# Patient Record
Sex: Female | Born: 1935 | Race: White | Hispanic: No | Smoking: Never smoker
Health system: Southern US, Community
[De-identification: ages and names within clinical notes are randomized; demographics above are authoritative.]

## PROBLEM LIST (undated history)

## (undated) DIAGNOSIS — C44320 Squamous cell carcinoma of skin of unspecified parts of face: Secondary | ICD-10-CM

## (undated) DIAGNOSIS — B019 Varicella without complication: Secondary | ICD-10-CM

## (undated) DIAGNOSIS — E039 Hypothyroidism, unspecified: Secondary | ICD-10-CM

## (undated) DIAGNOSIS — T8859XA Other complications of anesthesia, initial encounter: Secondary | ICD-10-CM

## (undated) DIAGNOSIS — K219 Gastro-esophageal reflux disease without esophagitis: Secondary | ICD-10-CM

## (undated) DIAGNOSIS — Z0000 Encounter for general adult medical examination without abnormal findings: Secondary | ICD-10-CM

## (undated) DIAGNOSIS — Z974 Presence of external hearing-aid: Secondary | ICD-10-CM

## (undated) DIAGNOSIS — E782 Mixed hyperlipidemia: Secondary | ICD-10-CM

## (undated) DIAGNOSIS — G20 Parkinson's disease: Secondary | ICD-10-CM

## (undated) DIAGNOSIS — I10 Essential (primary) hypertension: Secondary | ICD-10-CM

## (undated) DIAGNOSIS — T4145XA Adverse effect of unspecified anesthetic, initial encounter: Secondary | ICD-10-CM

## (undated) DIAGNOSIS — G20A1 Parkinson's disease without dyskinesia, without mention of fluctuations: Secondary | ICD-10-CM

## (undated) DIAGNOSIS — I4891 Unspecified atrial fibrillation: Secondary | ICD-10-CM

## (undated) DIAGNOSIS — I350 Nonrheumatic aortic (valve) stenosis: Secondary | ICD-10-CM

## (undated) DIAGNOSIS — K635 Polyp of colon: Secondary | ICD-10-CM

## (undated) HISTORY — DX: Varicella without complication: B01.9

## (undated) HISTORY — DX: Encounter for general adult medical examination without abnormal findings: Z00.00

## (undated) HISTORY — PX: INSERT / REPLACE / REMOVE PACEMAKER: SUR710

## (undated) HISTORY — PX: APPENDECTOMY: SHX54

## (undated) HISTORY — PX: AXILLARY LYMPH NODE BIOPSY: SHX5737

## (undated) HISTORY — DX: Polyp of colon: K63.5

## (undated) HISTORY — PX: TONSILLECTOMY: SUR1361

## (undated) HISTORY — DX: Mixed hyperlipidemia: E78.2

## (undated) HISTORY — PX: WISDOM TOOTH EXTRACTION: SHX21

## (undated) HISTORY — DX: Essential (primary) hypertension: I10

## (undated) HISTORY — DX: Squamous cell carcinoma of skin of unspecified parts of face: C44.320

## (undated) HISTORY — DX: Hypothyroidism, unspecified: E03.9

## (undated) HISTORY — DX: Nonrheumatic aortic (valve) stenosis: I35.0

## (undated) HISTORY — PX: CHOLECYSTECTOMY: SHX55

---
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
## 2004-03-19 ENCOUNTER — Ambulatory Visit: Payer: Self-pay | Admitting: Internal Medicine

## 2005-03-20 ENCOUNTER — Ambulatory Visit: Payer: Self-pay | Admitting: Internal Medicine

## 2005-03-26 ENCOUNTER — Inpatient Hospital Stay: Payer: Self-pay | Admitting: Surgery

## 2005-03-27 ENCOUNTER — Other Ambulatory Visit: Payer: Self-pay

## 2005-11-22 ENCOUNTER — Ambulatory Visit: Payer: Self-pay | Admitting: Unknown Physician Specialty

## 2006-04-07 ENCOUNTER — Ambulatory Visit: Payer: Self-pay | Admitting: Internal Medicine

## 2007-06-03 ENCOUNTER — Ambulatory Visit: Payer: Self-pay | Admitting: Internal Medicine

## 2008-01-31 ENCOUNTER — Ambulatory Visit: Payer: Self-pay | Admitting: Unknown Physician Specialty

## 2008-02-23 ENCOUNTER — Ambulatory Visit: Payer: Self-pay | Admitting: Unknown Physician Specialty

## 2008-03-14 ENCOUNTER — Ambulatory Visit: Payer: Self-pay | Admitting: Unknown Physician Specialty

## 2008-06-05 ENCOUNTER — Ambulatory Visit: Payer: Self-pay | Admitting: Internal Medicine

## 2009-06-06 ENCOUNTER — Ambulatory Visit: Payer: Self-pay | Admitting: Internal Medicine

## 2010-06-10 ENCOUNTER — Ambulatory Visit: Payer: Self-pay | Admitting: Internal Medicine

## 2010-12-10 ENCOUNTER — Observation Stay: Payer: Self-pay | Admitting: Internal Medicine

## 2011-06-18 ENCOUNTER — Ambulatory Visit: Payer: Self-pay | Admitting: Internal Medicine

## 2012-06-18 ENCOUNTER — Ambulatory Visit: Payer: Self-pay | Admitting: Internal Medicine

## 2013-06-20 ENCOUNTER — Ambulatory Visit: Payer: Self-pay | Admitting: Internal Medicine

## 2015-02-07 DIAGNOSIS — I350 Nonrheumatic aortic (valve) stenosis: Secondary | ICD-10-CM

## 2015-02-07 HISTORY — DX: Nonrheumatic aortic (valve) stenosis: I35.0

## 2015-08-09 DIAGNOSIS — E039 Hypothyroidism, unspecified: Secondary | ICD-10-CM

## 2015-08-09 DIAGNOSIS — I10 Essential (primary) hypertension: Secondary | ICD-10-CM

## 2015-08-09 DIAGNOSIS — E782 Mixed hyperlipidemia: Secondary | ICD-10-CM

## 2015-08-09 DIAGNOSIS — E785 Hyperlipidemia, unspecified: Secondary | ICD-10-CM | POA: Insufficient documentation

## 2015-08-09 HISTORY — DX: Mixed hyperlipidemia: E78.2

## 2015-08-09 HISTORY — DX: Essential (primary) hypertension: I10

## 2015-08-09 HISTORY — DX: Hypothyroidism, unspecified: E03.9

## 2016-02-11 DIAGNOSIS — Z0000 Encounter for general adult medical examination without abnormal findings: Secondary | ICD-10-CM

## 2016-02-11 HISTORY — DX: Encounter for general adult medical examination without abnormal findings: Z00.00

## 2016-06-19 DIAGNOSIS — C44320 Squamous cell carcinoma of skin of unspecified parts of face: Secondary | ICD-10-CM

## 2016-06-19 HISTORY — DX: Squamous cell carcinoma of skin of unspecified parts of face: C44.320

## 2016-07-08 HISTORY — PX: MOHS SURGERY: SUR867

## 2017-08-24 ENCOUNTER — Encounter: Payer: Self-pay | Admitting: *Deleted

## 2017-08-24 ENCOUNTER — Other Ambulatory Visit: Payer: Self-pay | Admitting: *Deleted

## 2017-08-24 ENCOUNTER — Telehealth: Payer: Self-pay | Admitting: *Deleted

## 2017-08-24 NOTE — Telephone Encounter (Signed)
Fax referral from Dr. Yevonne Pax, MD of [facility] placed in scheduling box. P#[phone]  F# [phone]

## 2017-10-08 DIAGNOSIS — Z952 Presence of prosthetic heart valve: Secondary | ICD-10-CM

## 2017-10-08 HISTORY — DX: Presence of prosthetic heart valve: Z95.2

## 2017-10-08 HISTORY — PX: AORTIC VALVE REPLACEMENT: SHX41

## 2017-11-03 DIAGNOSIS — Z950 Presence of cardiac pacemaker: Secondary | ICD-10-CM

## 2017-11-03 HISTORY — DX: Presence of cardiac pacemaker: Z95.0

## 2017-11-05 ENCOUNTER — Encounter
Admission: RE | Admit: 2017-11-05 | Discharge: 2017-11-05 | Disposition: A | Discharge status: Home / Self Care | Payer: Self-pay | Source: Ambulatory Visit | Attending: Internal Medicine | Admitting: Internal Medicine

## 2018-05-04 ENCOUNTER — Emergency Department: Payer: Medicare Other

## 2018-05-04 ENCOUNTER — Encounter: Payer: Self-pay | Admitting: Emergency Medicine

## 2018-05-04 ENCOUNTER — Other Ambulatory Visit: Payer: Self-pay

## 2018-05-04 ENCOUNTER — Observation Stay
Admission: EM | Admit: 2018-05-04 | Discharge: 2018-05-05 | Disposition: A | Discharge status: Home / Self Care | Payer: Medicare Other | Attending: Internal Medicine | Admitting: Internal Medicine

## 2018-05-04 DIAGNOSIS — Z79899 Other long term (current) drug therapy: Secondary | ICD-10-CM | POA: Diagnosis not present

## 2018-05-04 DIAGNOSIS — I248 Other forms of acute ischemic heart disease: Secondary | ICD-10-CM | POA: Diagnosis not present

## 2018-05-04 DIAGNOSIS — E782 Mixed hyperlipidemia: Secondary | ICD-10-CM | POA: Insufficient documentation

## 2018-05-04 DIAGNOSIS — Z85828 Personal history of other malignant neoplasm of skin: Secondary | ICD-10-CM | POA: Diagnosis not present

## 2018-05-04 DIAGNOSIS — R002 Palpitations: Secondary | ICD-10-CM | POA: Diagnosis not present

## 2018-05-04 DIAGNOSIS — N183 Chronic kidney disease, stage 3 (moderate): Secondary | ICD-10-CM | POA: Diagnosis not present

## 2018-05-04 DIAGNOSIS — I129 Hypertensive chronic kidney disease with stage 1 through stage 4 chronic kidney disease, or unspecified chronic kidney disease: Secondary | ICD-10-CM | POA: Diagnosis not present

## 2018-05-04 DIAGNOSIS — Z7982 Long term (current) use of aspirin: Secondary | ICD-10-CM | POA: Insufficient documentation

## 2018-05-04 DIAGNOSIS — E039 Hypothyroidism, unspecified: Secondary | ICD-10-CM | POA: Diagnosis not present

## 2018-05-04 DIAGNOSIS — Z888 Allergy status to other drugs, medicaments and biological substances status: Secondary | ICD-10-CM | POA: Insufficient documentation

## 2018-05-04 DIAGNOSIS — Z952 Presence of prosthetic heart valve: Secondary | ICD-10-CM | POA: Diagnosis not present

## 2018-05-04 DIAGNOSIS — Z79890 Hormone replacement therapy (postmenopausal): Secondary | ICD-10-CM | POA: Insufficient documentation

## 2018-05-04 DIAGNOSIS — I471 Supraventricular tachycardia: Principal | ICD-10-CM | POA: Insufficient documentation

## 2018-05-04 DIAGNOSIS — Z950 Presence of cardiac pacemaker: Secondary | ICD-10-CM | POA: Diagnosis not present

## 2018-05-04 DIAGNOSIS — Z8249 Family history of ischemic heart disease and other diseases of the circulatory system: Secondary | ICD-10-CM | POA: Diagnosis not present

## 2018-05-04 DIAGNOSIS — R778 Other specified abnormalities of plasma proteins: Secondary | ICD-10-CM

## 2018-05-04 DIAGNOSIS — R7989 Other specified abnormal findings of blood chemistry: Secondary | ICD-10-CM | POA: Diagnosis present

## 2018-05-04 DIAGNOSIS — N390 Urinary tract infection, site not specified: Secondary | ICD-10-CM

## 2018-05-04 DIAGNOSIS — Z885 Allergy status to narcotic agent status: Secondary | ICD-10-CM | POA: Insufficient documentation

## 2018-05-04 LAB — URINALYSIS, COMPLETE (UACMP) WITH MICROSCOPIC
Bilirubin Urine: NEGATIVE
Glucose, UA: NEGATIVE mg/dL
Ketones, ur: NEGATIVE mg/dL
Nitrite: POSITIVE — AB
Protein, ur: NEGATIVE mg/dL
SPECIFIC GRAVITY, URINE: 1.008 (ref 1.005–1.030)
WBC, UA: >50 WBC/hpf — ABNORMAL HIGH (ref 0–5)
pH: 6 (ref 5.0–8.0)

## 2018-05-04 LAB — BASIC METABOLIC PANEL
Anion gap: 9 (ref 5–15)
BUN: 22 mg/dL (ref 8–23)
CO2: 24 mmol/L (ref 22–32)
CREATININE: 1.08 mg/dL — AB (ref 0.44–1.00)
Calcium: 9.2 mg/dL (ref 8.9–10.3)
Chloride: 106 mmol/L (ref 98–111)
GFR calc Af Amer: 52 mL/min — ABNORMAL LOW (ref >60)
GFR calc non Af Amer: 45 mL/min — ABNORMAL LOW (ref >60)
Glucose, Bld: 99 mg/dL (ref 70–99)
Potassium: 4 mmol/L (ref 3.5–5.1)
Sodium: 139 mmol/L (ref 135–145)

## 2018-05-04 LAB — MAGNESIUM: Magnesium: 2.2 mg/dL (ref 1.7–2.4)

## 2018-05-04 LAB — CBC
HCT: 34.7 % — ABNORMAL LOW (ref 36.0–46.0)
Hemoglobin: 11.3 g/dL — ABNORMAL LOW (ref 12.0–15.0)
MCH: 32.2 pg (ref 26.0–34.0)
MCHC: 32.6 g/dL (ref 30.0–36.0)
MCV: 98.9 fL (ref 80.0–100.0)
Platelets: 550 10*3/uL — ABNORMAL HIGH (ref 150–400)
RBC: 3.51 MIL/uL — ABNORMAL LOW (ref 3.87–5.11)
RDW: 13.6 % (ref 11.5–15.5)
WBC: 11.5 10*3/uL — ABNORMAL HIGH (ref 4.0–10.5)
nRBC: 0.2 % (ref 0.0–0.2)

## 2018-05-04 LAB — MRSA PCR SCREENING: MRSA by PCR: NEGATIVE

## 2018-05-04 LAB — PROTIME-INR
INR: 0.9 (ref 0.8–1.2)
Prothrombin Time: 11.7 seconds (ref 11.4–15.2)

## 2018-05-04 LAB — BRAIN NATRIURETIC PEPTIDE: B Natriuretic Peptide: 210 pg/mL — ABNORMAL HIGH (ref 0.0–100.0)

## 2018-05-04 LAB — TSH: TSH: 3.037 u[IU]/mL (ref 0.350–4.500)

## 2018-05-04 LAB — TROPONIN I
Troponin I: <0.03 ng/mL (ref <0.03)
Troponin I: 0.07 ng/mL (ref <0.03)
Troponin I: 0.11 ng/mL (ref <0.03)

## 2018-05-04 LAB — APTT: aPTT: <24 seconds — ABNORMAL LOW (ref 24–36)

## 2018-05-04 MED ORDER — PNEUMOCOCCAL VAC POLYVALENT 25 MCG/0.5ML IJ INJ: 0.5000 mL | INJECTION | INTRAMUSCULAR | Status: DC

## 2018-05-04 MED ORDER — ATORVASTATIN CALCIUM 10 MG PO TABS
10.0000 mg | ORAL_TABLET | ORAL | Status: DC
  Administered 2018-05-05: 10 mg via ORAL
  Filled 2018-05-04: qty 1

## 2018-05-04 MED ORDER — VITAMINS A & D EX OINT
1.0000 "application " | TOPICAL_OINTMENT | Freq: Every day | CUTANEOUS | Status: DC
  Administered 2018-05-05: 1 via TOPICAL
  Filled 2018-05-04: qty 56.7

## 2018-05-04 MED ORDER — SODIUM CHLORIDE 0.9 % IV SOLN: INTRAVENOUS | Status: DC | PRN

## 2018-05-04 MED ORDER — SODIUM CHLORIDE 0.9% FLUSH
3.0000 mL | Freq: Two times a day (BID) | INTRAVENOUS | Status: DC
  Administered 2018-05-04: 3 mL via INTRAVENOUS

## 2018-05-04 MED ORDER — ONDANSETRON HCL 4 MG/2ML IJ SOLN: 4.0000 mg | Freq: Four times a day (QID) | INTRAMUSCULAR | Status: DC | PRN

## 2018-05-04 MED ORDER — ONDANSETRON HCL 4 MG PO TABS: 4.0000 mg | ORAL_TABLET | Freq: Four times a day (QID) | ORAL | Status: DC | PRN

## 2018-05-04 MED ORDER — ENOXAPARIN SODIUM 40 MG/0.4ML ~~LOC~~ SOLN
40.0000 mg | SUBCUTANEOUS | Status: DC
  Administered 2018-05-04: 40 mg via SUBCUTANEOUS
  Filled 2018-05-04: qty 0.4

## 2018-05-04 MED ORDER — ASPIRIN 81 MG PO CHEW
243.0000 mg | CHEWABLE_TABLET | Freq: Once | ORAL | Status: AC
  Administered 2018-05-04: 243 mg via ORAL
  Filled 2018-05-04: qty 3

## 2018-05-04 MED ORDER — ACETAMINOPHEN 325 MG PO TABS: 650.0000 mg | ORAL_TABLET | Freq: Four times a day (QID) | ORAL | Status: DC | PRN

## 2018-05-04 MED ORDER — METOPROLOL SUCCINATE ER 25 MG PO TB24: 25.0000 mg | ORAL_TABLET | Freq: Every day | ORAL | 0 refills | Status: DC

## 2018-05-04 MED ORDER — ACETAMINOPHEN 650 MG RE SUPP: 650.0000 mg | Freq: Four times a day (QID) | RECTAL | Status: DC | PRN

## 2018-05-04 MED ORDER — LEVOTHYROXINE SODIUM 50 MCG PO TABS: 50.0000 ug | ORAL_TABLET | Freq: Every day | ORAL | Status: DC

## 2018-05-04 MED ORDER — SODIUM CHLORIDE 0.9 % IV SOLN
1.0000 g | Freq: Once | INTRAVENOUS | Status: AC
  Administered 2018-05-04: 1 g via INTRAVENOUS
  Filled 2018-05-04: qty 10

## 2018-05-04 MED ORDER — ADULT MULTIVITAMIN W/MINERALS CH
1.0000 | ORAL_TABLET | Freq: Every day | ORAL | Status: DC
  Administered 2018-05-05: 1 via ORAL
  Filled 2018-05-04: qty 1

## 2018-05-04 MED ORDER — SULFAMETHOXAZOLE-TRIMETHOPRIM 800-160 MG PO TABS: 1.0000 | ORAL_TABLET | Freq: Two times a day (BID) | ORAL | 0 refills | Status: DC

## 2018-05-04 MED ORDER — ASPIRIN EC 81 MG PO TBEC
81.0000 mg | DELAYED_RELEASE_TABLET | Freq: Every day | ORAL | Status: DC
  Administered 2018-05-05: 81 mg via ORAL
  Filled 2018-05-04: qty 1

## 2018-05-04 MED ORDER — ATORVASTATIN CALCIUM 20 MG PO TABS: 10.0000 mg | ORAL_TABLET | ORAL | Status: DC

## 2018-05-04 MED ORDER — FERROUS GLUCONATE 324 (38 FE) MG PO TABS
324.0000 mg | ORAL_TABLET | Freq: Every day | ORAL | Status: DC
  Administered 2018-05-05: 324 mg via ORAL
  Filled 2018-05-04: qty 1

## 2018-05-04 MED ORDER — CIPROFLOXACIN HCL 500 MG PO TABS
250.0000 mg | ORAL_TABLET | Freq: Two times a day (BID) | ORAL | Status: DC
  Administered 2018-05-04: 250 mg via ORAL
  Filled 2018-05-04 (×3): qty 0.5

## 2018-05-04 NOTE — ED Triage Notes (Signed)
Patient presents to the ED via EMS for SVT that converted to sinus rhythm with EMS when they started the IV.  Patient's blood sugar was 136.  BP: 142/78, 98% on room air.

## 2018-05-04 NOTE — ED Provider Notes (Signed)
[facility] Emergency Department Provider Note  ____________________________________________  Time seen: Approximately 12:12 PM  I have reviewed the triage vital signs and the nursing notes.   HISTORY  Chief Complaint Tachycardia    HPI Olivia [name] is a 85 y.o. female with a history of AVR on low-dose aspirin, HTN, HL, presenting for SVT, now resolved.  The patient reports that she was just about to take her shower when she had an acute onset of a "funny feeling" with associated lightheadedness.  She called the staff at the [facility], and was found to be in SVT.  She did not have any chest pain, shortness of breath, syncope, diaphoresis, nausea or vomiting.  Upon EMS arrival, the patient was instructed to bear down and her SVT broke, resulting in normal sinus rhythm.  Patient denies any recent illness or changes in her medications.  Upon reviewing the patient's EMS rhythms, she had a rate of 173 bpm with  rate related ischemic changes, including ST depressions diffusely in leads I, 2, V2 through V6, and ST elevation in V1 and aVR, which resolved when she returned to normal sinus rhythm.  At this time, the patient is asymptomatic.  Past Medical History:  Diagnosis Date  . Acquired hypothyroidism 08/09/2015  . Aortic stenosis, moderate 02/07/2015   Stable 5/17, slightly worse 5/18  . Benign essential hypertension 08/09/2015  . Chicken pox   . Colon polyps   . Hyperlipidemia, mixed 08/09/2015  . Medicare annual wellness visit, initial 02/11/2016   12/17,5/18, 6/19  12/17,5/18  . SCC (squamous cell carcinoma), face 06/19/2016    Patient Active Problem List   Diagnosis Date Noted  . SCC (squamous cell carcinoma), face 06/19/2016  . Medicare annual wellness visit, initial 02/11/2016  . Acquired hypothyroidism 08/09/2015  . Benign essential hypertension 08/09/2015  . Hyperlipidemia, mixed 08/09/2015  . Aortic stenosis, moderate 02/07/2015    Past Surgical  History:  Procedure Laterality Date  . APPENDECTOMY    . AXILLARY LYMPH NODE BIOPSY     needle biopsy   . CHOLECYSTECTOMY    . MOHS SURGERY  07/2016   Dr. Manley Mason  . TONSILLECTOMY    . WISDOM TOOTH EXTRACTION      Current Outpatient Rx  . Order #: [id number] Class: Historical Med  . Order #: [id number] Class: Historical Med  . Order #: [id number] Class: Historical Med  . Order #: [id number] Class: Historical Med  . Order #: [id number] Class: Historical Med  . Order #: [id number] Class: Historical Med  . Order #: [id number] Class: Historical Med  . Order #: [id number] Class: Historical Med  . Order #: [id number] Class: Print  . Order #: [id number] Class: Print    Allergies Ace inhibitors; Alendronate; Fentanyl; and Morphine  Family History  Problem Relation Age of Onset  . Hypertension Mother   . Prostate cancer Father   . Healthy Sister   . Aortic stenosis Brother     Social History Social History   Tobacco Use  . Smoking status: Never Smoker  . Smokeless tobacco: Never Used  Substance Use Topics  . Alcohol use: Yes  . Drug use: Never    Review of Systems Constitutional: No fever/chills.  Positive lightheadedness.  No syncope.  No diaphoresis. Eyes: No visual changes. ENT: No sore throat. No congestion or rhinorrhea. Cardiovascular: Denies chest pain.  Positive palpitations. Respiratory: Denies shortness of breath.  No cough. Gastrointestinal: No abdominal pain.  No nausea, no vomiting.  No diarrhea.  No constipation. Genitourinary: Negative for dysuria. Musculoskeletal: Negative for  back pain.  No lower extremity swelling or calf pain. Skin: Negative for rash. Neurological: Negative for headaches. No focal numbness, tingling or weakness.     ____________________________________________   PHYSICAL EXAM:  VITAL SIGNS: ED Triage Vitals [05/04/18 1205]  Enc Vitals Group     BP 139/77     Pulse Rate 93     Resp (!) 22     Temp 97.9 F (36.6 C)     Temp Source Oral      SpO2 97 %     Weight 167 lb (75.8 kg)     Height 5\' 6"  (1.676 m)     Head Circumference      Peak Flow      Pain Score      Pain Loc      Pain Edu?      Excl. in [place]?     Constitutional: Alert and oriented.Answers questions appropriately.  Appearing for her age, smiling and making jokes. Eyes: Conjunctivae are normal.  EOMI. No scleral icterus. Head: Atraumatic. Nose: No congestion/rhinnorhea. Mouth/Throat: Mucous membranes are moist.  Neck: No stridor.  Supple.  No JVD.  No meningismus. Cardiovascular: Normal rate, regular rhythm. No murmurs, rubs or gallops.  Respiratory: Normal respiratory effort.  No accessory muscle use or retractions. Lungs CTAB.  No wheezes, rales or ronchi. Gastrointestinal: Soft, nontender and nondistended.  No guarding or rebound.  No peritoneal signs. Musculoskeletal: No LE edema. No ttp in the calves or palpable cords.  Negative Homan's sign. Neurologic:  A&Ox3.  Speech is clear.  Face and smile are symmetric.  EOMI.  Moves all extremities well. Skin:  Skin is warm, dry and intact. No rash noted. Psychiatric: Mood and affect are normal. Speech and behavior are normal.  Normal judgement  ____________________________________________   LABS (all labs ordered are listed, but only abnormal results are displayed)  Labs Reviewed  CBC - Abnormal; Notable for the following components:      Result Value   WBC 11.5 (*)    RBC 3.51 (*)    Hemoglobin 11.3 (*)    HCT 34.7 (*)    Platelets 550 (*)    All other components within normal limits  BASIC METABOLIC PANEL - Abnormal; Notable for the following components:   Creatinine, Ser 1.08 (*)    GFR calc non Af Amer 45 (*)    GFR calc Af Amer 52 (*)    All other components within normal limits  BRAIN NATRIURETIC PEPTIDE - Abnormal; Notable for the following components:   B Natriuretic Peptide 210.0 (*)    All other components within normal limits  URINALYSIS, COMPLETE (UACMP) WITH MICROSCOPIC - Abnormal;  Notable for the following components:   Color, Urine YELLOW (*)    APPearance CLEAR (*)    Hgb urine dipstick SMALL (*)    Nitrite POSITIVE (*)    Leukocytes,Ua LARGE (*)    WBC, UA >50 (*)    Bacteria, UA RARE (*)    All other components within normal limits  APTT - Abnormal; Notable for the following components:   aPTT <24 (*)    All other components within normal limits  TROPONIN I - Abnormal; Notable for the following components:   Troponin I 0.07 (*)    All other components within normal limits  URINE CULTURE  TROPONIN I  TSH  MAGNESIUM  PROTIME-INR   ____________________________________________  EKG  ED ECG REPORT I, Anne-Caroline Mariea Clonts, the attending physician, personally viewed and interpreted this ECG.  Date: 05/04/2018  EKG Time: 1204  Rate: 91  Rhythm: normal sinus rhythm; RBBB  Axis: leftward  Intervals:none  ST&T Change: No STEMI  This EKG is compared to 10/12, which is the most recent EKG that we had, which did not show right bundle branch block.  ____________________________________________  RADIOLOGY  Dg Chest 2 View  Result Date: 05/04/2018 CLINICAL DATA:  Supraventricular tachycardia. EXAM: CHEST - 2 VIEW COMPARISON:  12/10/2010, 03/27/2005. FINDINGS: Cardiac silhouette normal in size, unchanged. Prior TAVR. Thoracic aorta mildly atherosclerotic. Hilar and mediastinal contours otherwise unremarkable. Lungs clear. Bronchovascular markings normal. Pulmonary vascularity normal. No visible pleural effusions. No pneumothorax. Degenerative changes involving the visualized lumbar spine. IMPRESSION: No acute cardiopulmonary disease. Electronically Signed   By: Evangeline Dakin M.D.   On: 05/04/2018 13:03    ____________________________________________   PROCEDURES  Procedure(s) performed: No  Procedures  Critical Care performed: Yes ____________________________________________   INITIAL IMPRESSION / ASSESSMENT AND PLAN / ED COURSE  Pertinent  labs & imaging results that were available during my care of the patient were reviewed by me and considered in my medical decision making (see chart for details).  85 y.o. female with a history of aortic valve repair, HTN, HL, presenting with a self resolved episode of SVT with ischemic changes that were likely rate related.  Overall, the patient is well-appearing and hemodynamically stable at this time.  We will check basic laboratory studies including electrolytes and magnesium, UA, and TSH.  In addition, will get a chest x-ray.  The patient will have troponin testing.  I will plan to speak to the unassigned cardiologist, as the patient does not follow with a cardiologist; she only follows with her cardiothoracic surgeon.  The patient will receive aspirin.  Plan reevaluation for final disposition.    The patient's repeat troponin is 0.07.  She continues to be asymptomatic and hemodynamically stable, however, I will admit her so that this troponin can be trended.  Is possible that it is elevated due to strain from her run of SVT, but ACS or MI are not completely ruled out.  She has received aspirin; she is asymptomatic at this time and heparin is not indicated.  CRITICAL CARE Performed by: Eula Listen   Total critical care time: 35 minutes  Critical care time was exclusive of separately billable procedures and treating other patients.  Critical care was necessary to treat or prevent imminent or life-threatening deterioration.  Critical care was time spent personally by me on the following activities: development of treatment plan with patient and/or surrogate as well as nursing, discussions with consultants, evaluation of patient's response to treatment, examination of patient, obtaining history from patient or surrogate, ordering and performing treatments and interventions, ordering and review of laboratory studies, ordering and review of radiographic studies, pulse oximetry and  re-evaluation of patient's condition.   ____________________________________________  FINAL CLINICAL IMPRESSION(S) / ED DIAGNOSES  Final diagnoses:  Urinary tract infection without hematuria, site unspecified  SVT (supraventricular tachycardia) (HCC)  Elevated troponin         NEW MEDICATIONS STARTED DURING THIS VISIT:  New Prescriptions   METOPROLOL SUCCINATE (TOPROL XL) 25 MG 24 HR TABLET    Take 1 tablet (25 mg total) by mouth daily.   SULFAMETHOXAZOLE-TRIMETHOPRIM (BACTRIM DS,SEPTRA DS) 800-160 MG TABLET    Take 1 tablet by mouth 2 (two) times daily for 7 days.      Eula Listen, MD 05/04/18 [phone]

## 2018-05-04 NOTE — Discharge Instructions (Signed)
Please drink plenty of fluid to stay well-hydrated and to help clear your urinary tract infection.  Please take the entire course of antibiotics, even if you are feeling better.  Please start metoprolol medication that can help prevent the fast heart rate from coming back.  Please follow-up with Dr. Nehemiah Massed, the cardiologist on-call.  Turn to the emergency department if you develop severe pain, lightheadedness or fainting, palpitations, chest pain or shortness of breath, fever, nausea or vomiting, or any other symptoms concerning to you.

## 2018-05-04 NOTE — Consult Note (Signed)
[facility] Cardiology Consultation Note  Patient ID: Olivia [name], MRN: [id number], DOB/AGE: June 30, 1925 85 y.o. Admit date: 05/04/2018   Date of Consult: 05/04/2018 Primary Physician: Rusty Aus, MD Primary Cardiologist: None  Chief Complaint:  Chief Complaint  Patient presents with  . Tachycardia   Reason for Consult: Atrial fibrillation  HPI: 85 y.o. female with known severe aortic valve stenosis status post TAVR with chronic kidney disease's stage III who has recovered from her TAVR very well with no evidence of significant major issues.  She is fairly active and was getting up moving around in her apartment when she had significant dizziness and palpitations.  She sat down and then continued to have palpitations for quite some time.  She was seen by the rescue squad for which showed noted atrial fibrillation with rapid ventricular rate.  The patient spontaneously converted to normal sinus rhythm with appropriate medication management and currently is feeling much better.  She did have a minimal troponin elevation of 0.07 most consistent with demand ischemia rather than acute coronary syndrome.  Chest x-ray was normal.  Currently EKG shows normal sinus rhythm  Past Medical History:  Diagnosis Date  . Acquired hypothyroidism 08/09/2015  . Aortic stenosis, moderate 02/07/2015   Stable 5/17, slightly worse 5/18  . Benign essential hypertension 08/09/2015  . Chicken pox   . Colon polyps   . Hyperlipidemia, mixed 08/09/2015  . Medicare annual wellness visit, initial 02/11/2016   12/17,5/18, 6/19  12/17,5/18  . SCC (squamous cell carcinoma), face 06/19/2016      Surgical History:  Past Surgical History:  Procedure Laterality Date  . APPENDECTOMY    . AXILLARY LYMPH NODE BIOPSY     needle biopsy   . CHOLECYSTECTOMY    . MOHS SURGERY  07/2016   Dr. Manley Mason  . TONSILLECTOMY    . WISDOM TOOTH EXTRACTION       Home Meds: Prior to Admission medications   Medication Sig Start Date  End Date Taking? Authorizing Provider  aspirin EC 81 MG tablet Take 81 mg by mouth daily.    Yes [provider]  atorvastatin (LIPITOR) 10 MG tablet Take 10 mg by mouth every other day.  09/18/16  Yes [provider]  clobetasol cream (TEMOVATE) 0.05 % APPLY TOPICALLY 3 TIMES A WEEK 02/12/17  Yes [provider]  Diaper Rash Products (A+D DIAPER RASH) CREA Apply topically daily.    Yes [provider]  ferrous gluconate (FERGON) 324 MG tablet Take 324 mg by mouth daily. 04/19/18 04/19/19 Yes [provider]  levothyroxine (SYNTHROID, LEVOTHROID) 50 MCG tablet TAKE ONE TABLET BY MOUTH DAILY 07/14/17  Yes [provider]  Multiple Vitamins-Minerals (MULTIVITAMIN ADULT) TABS Take 1 tablet by mouth daily.   Yes [provider]  Vitamins A & D (VITAMIN A & D) ointment Apply 1 application topically daily.   Yes [provider]  metoprolol succinate (TOPROL XL) 25 MG 24 hr tablet Take 1 tablet (25 mg total) by mouth daily. 05/04/18 05/04/19  Eula Listen, MD  sulfamethoxazole-trimethoprim (BACTRIM DS,SEPTRA DS) 800-160 MG tablet Take 1 tablet by mouth 2 (two) times daily for 7 days. 05/04/18 05/11/18  Eula Listen, MD    Inpatient Medications:  . aspirin EC  81 mg Oral Daily  . [START ON 05/05/2018] atorvastatin  10 mg Oral QODAY  . ciprofloxacin  250 mg Oral BID  . enoxaparin (LOVENOX) injection  40 mg Subcutaneous Q24H  . ferrous gluconate  324 mg Oral  Daily  . levothyroxine  50 mcg Oral Daily  . multivitamin with minerals  1 tablet Oral Daily  . vitamin A & D  1 application Topical Daily   . sodium chloride      Allergies:  Allergies  Allergen Reactions  . Ace Inhibitors     Other reaction(s): Cough  . Alendronate     Muscle pain  . Fentanyl Nausea And Vomiting  . Morphine Other (See Comments)    Other reaction(s): Muscle Pain    Social History   Socioeconomic History  . Marital status: Widowed     Spouse name: Not on file  . Number of children: Not on file  . Years of education: Not on file  . Highest education level: Not on file  Occupational History  . Not on file  Social Needs  . Financial resource strain: Not on file  . Food insecurity:    Worry: Not on file    Inability: Not on file  . Transportation needs:    Medical: Not on file    Non-medical: Not on file  Tobacco Use  . Smoking status: Never Smoker  . Smokeless tobacco: Never Used  Substance and Sexual Activity  . Alcohol use: Yes  . Drug use: Never  . Sexual activity: Not on file  Lifestyle  . Physical activity:    Days per week: Not on file    Minutes per session: Not on file  . Stress: Not on file  Relationships  . Social connections:    Talks on phone: Not on file    Gets together: Not on file    Attends religious service: Not on file    Active member of club or organization: Not on file    Attends meetings of clubs or organizations: Not on file    Relationship status: Not on file  . Intimate partner violence:    Fear of current or ex partner: Not on file    Emotionally abused: Not on file    Physically abused: Not on file    Forced sexual activity: Not on file  Other Topics Concern  . Not on file  Social History Narrative  . Not on file     Family History  Problem Relation Age of Onset  . Hypertension Mother   . Prostate cancer Father   . Healthy Sister   . Aortic stenosis Brother      Review of Systems Positive for dizziness palpitations Negative for: General:  chills, fever, night sweats or weight changes.  Cardiovascular: PND orthopnea syncope dizziness  Dermatological skin lesions rashes Respiratory: Cough congestion Urologic: Frequent urination urination at night and hematuria Abdominal: negative for nausea, vomiting, diarrhea, bright red blood per rectum, melena, or hematemesis Neurologic: negative for visual changes, and/or hearing changes  All other systems reviewed and are  otherwise negative except as noted above.  Labs: Recent Labs    05/04/18 1203 05/04/18 1517  TROPONINI <0.03 0.07*   Lab Results  Component Value Date   WBC 11.5 (H) 05/04/2018   HGB 11.3 (L) 05/04/2018   HCT 34.7 (L) 05/04/2018   MCV 98.9 05/04/2018   PLT 550 (H) 05/04/2018    Recent Labs  Lab 05/04/18 1203  NA 139  K 4.0  CL 106  CO2 24  BUN 22  CREATININE 1.08*  CALCIUM 9.2  GLUCOSE 99   No results found for: CHOL, HDL, LDLCALC, TRIG No results found for: DDIMER  Radiology/Studies:  Dg Chest 2 View  Result Date: 05/04/2018 CLINICAL DATA:  Supraventricular tachycardia. EXAM: CHEST - 2 VIEW COMPARISON:  12/10/2010, 03/27/2005. FINDINGS: Cardiac silhouette normal in size, unchanged. Prior TAVR. Thoracic aorta mildly atherosclerotic. Hilar and mediastinal contours otherwise unremarkable. Lungs clear. Bronchovascular markings normal. Pulmonary vascularity normal. No visible pleural effusions. No pneumothorax. Degenerative changes involving the visualized lumbar spine. IMPRESSION: No acute cardiopulmonary disease. Electronically Signed   By: Evangeline Dakin M.D.   On: 05/04/2018 13:03    EKG: Normal sinus rhythm.  Normal EKG  Weights: Filed Weights   05/04/18 1205  Weight: 75.8 kg     Physical Exam: Blood pressure (!) 126/110, pulse 73, temperature 97.9 F (36.6 C), temperature source Oral, resp. rate 17, height 5\' 6"  (1.676 m), weight 75.8 kg, SpO2 98 %. Body mass index is 26.95 kg/m. General: Well developed, well nourished, in no acute distress. Head eyes ears nose throat: Normocephalic, atraumatic, sclera non-icteric, no xanthomas, nares are without discharge. No apparent thyromegaly and/or mass  Lungs: Normal respiratory effort.  no wheezes, no rales, no rhonchi.  Heart: RRR with normal S1 S2.  2-3+ aortic murmur gallop, no rub, PMI is normal size and placement, carotid upstroke normal without bruit, jugular venous pressure is normal Abdomen: Soft, non-tender,  non-distended with normoactive bowel sounds. No hepatomegaly. No rebound/guarding. No obvious abdominal masses. Abdominal aorta is normal size without bruit Extremities: No edema. no cyanosis, no clubbing, no ulcers  Peripheral : 2+ bilateral upper extremity pulses, 2+ bilateral femoral pulses, 2+ bilateral dorsal pedal pulse Neuro: Alert and oriented. No facial asymmetry. No focal deficit. Moves all extremities spontaneously. Musculoskeletal: Normal muscle tone without kyphosis Psych:  Responds to questions appropriately with a normal affect.    Assessment: 85 year old female with known aortic stenosis status post TAVR with atrial fibrillation with rapid ventricular rate causing dizziness with minimal elevation of troponin consistent with demand ischemia  Plan: 1.  Continue use of metoprolol low-dose for better heart rate control and maintenance of normal sinus rhythm 2.  Anticoagulation for further risk reduction in stroke with atrial fibrillation and would consider Eliquis 3.  No further cardiac diagnostics necessary at this time due to recent TAVR and improvements 4.  Serial ECG and enzymes to assess for myocardial infarction 5.  Begin ambulation and follow for improvements of symptoms and watch for need for adjustments of medications  Signed, Corey Skains M.D. [facility] 05/04/2018, 7:28 PM

## 2018-05-04 NOTE — ED Notes (Signed)
ED TO INPATIENT HANDOFF REPORT  Name/Age/Gender Olivia [name] 85 y.o. female  Code Status    Code Status Orders  (From admission, onward)         Start     Ordered   05/04/18 1825  Full code  Continuous     05/04/18 1824        Code Status History    This patient has a current code status but no historical code status.      Home/SNF/Other [facility]  Chief Complaint heart problems  Level of Care/Admitting Diagnosis ED Disposition    ED Disposition Condition Comment   Admit  Hospital Area: [facility] [[id number]]  Level of Care: Telemetry [5]  Diagnosis: SVT (supraventricular tachycardia) ([place]) [604540]  Admitting Physician: Henreitta Leber [[id number]]  Attending Physician: Henreitta Leber [phone]  PT Class (Do Not Modify): Observation [104]  PT Acc Code (Do Not Modify): Observation [10022]       Medical History Past Medical History:  Diagnosis Date  . Acquired hypothyroidism 08/09/2015  . Aortic stenosis, moderate 02/07/2015   Stable 5/17, slightly worse 5/18  . Benign essential hypertension 08/09/2015  . Chicken pox   . Colon polyps   . Hyperlipidemia, mixed 08/09/2015  . Medicare annual wellness visit, initial 02/11/2016   12/17,5/18, 6/19  12/17,5/18  . SCC (squamous cell carcinoma), face 06/19/2016    Allergies Allergies  Allergen Reactions  . Ace Inhibitors     Other reaction(s): Cough  . Alendronate     Muscle pain  . Fentanyl Nausea And Vomiting  . Morphine Other (See Comments)    Other reaction(s): Muscle Pain    IV Location/Drains/Wounds Patient Lines/Drains/Airways Status   Active Line/Drains/Airways    Name:   Placement date:   Placement time:   Site:   Days:   Peripheral IV 05/04/18 Right Arm   05/04/18    1212    Arm   less than 1          Labs/Imaging Results for orders placed or performed during the hospital encounter of 05/04/18 (from the past 48 hour(s))  CBC     Status: Abnormal   Collection Time:  05/04/18 12:03 PM  Result Value Ref Range   WBC 11.5 (H) 4.0 - 10.5 K/uL   RBC 3.51 (L) 3.87 - 5.11 MIL/uL   Hemoglobin 11.3 (L) 12.0 - 15.0 g/dL   HCT 34.7 (L) 36.0 - 46.0 %   MCV 98.9 80.0 - 100.0 fL   MCH 32.2 26.0 - 34.0 pg   MCHC 32.6 30.0 - 36.0 g/dL   RDW 13.6 11.5 - 15.5 %   Platelets 550 (H) 150 - 400 K/uL   nRBC 0.2 0.0 - 0.2 %    Comment: Performed at [facility], [address] [place], [place] [address]  Basic metabolic panel     Status: Abnormal   Collection Time: 05/04/18 12:03 PM  Result Value Ref Range   Sodium 139 135 - 145 mmol/L   Potassium 4.0 3.5 - 5.1 mmol/L   Chloride 106 98 - 111 mmol/L   CO2 24 22 - 32 mmol/L   Glucose, Bld 99 70 - 99 mg/dL   BUN 22 8 - 23 mg/dL   Creatinine, Ser 1.08 (H) 0.44 - 1.00 mg/dL   Calcium 9.2 8.9 - 10.3 mg/dL   GFR calc non Af Amer 45 (L) >60 mL/min   GFR calc Af Amer 52 (L) >60 mL/min   Anion gap 9  5 - 15    Comment: Performed at [facility], [address] [place], [place] [address]  Troponin I - ONCE - STAT     Status: None   Collection Time: 05/04/18 12:03 PM  Result Value Ref Range   Troponin I <0.03 <0.03 ng/mL    Comment: Performed at [facility], [address] [place], [place] [address]  Brain natriuretic peptide     Status: Abnormal   Collection Time: 05/04/18 12:03 PM  Result Value Ref Range   B Natriuretic Peptide 210.0 (H) 0.0 - 100.0 pg/mL    Comment: Performed at [facility], [address] [place], [place] [address]  TSH     Status: None   Collection Time: 05/04/18 12:03 PM  Result Value Ref Range   TSH 3.037 0.350 - 4.500 uIU/mL    Comment: Performed by a 3rd Generation assay with a functional sensitivity of <=0.01 uIU/mL. Performed at [facility], [address], [place], [place] [address]   Magnesium     Status: None   Collection Time: 05/04/18 12:03 PM  Result Value Ref Range   Magnesium 2.2 1.7 - 2.4 mg/dL    Comment: Performed at  [facility], [address] [place], [place] [address]  Protime-INR     Status: None   Collection Time: 05/04/18 12:03 PM  Result Value Ref Range   Prothrombin Time 11.7 11.4 - 15.2 seconds   INR 0.9 0.8 - 1.2    Comment: (NOTE) INR goal varies based on device and disease states. Performed at [facility], [address] [place], [place] [address]   APTT     Status: Abnormal   Collection Time: 05/04/18 12:03 PM  Result Value Ref Range   aPTT <24 (L) 24 - 36 seconds    Comment: Performed at [facility], [address] [place], [place] [address]  Urinalysis, Complete w Microscopic     Status: Abnormal   Collection Time: 05/04/18 12:11 PM  Result Value Ref Range   Color, Urine YELLOW (A) YELLOW   APPearance CLEAR (A) CLEAR   Specific Gravity, Urine 1.008 1.005 - 1.030   pH 6.0 5.0 - 8.0   Glucose, UA NEGATIVE NEGATIVE mg/dL   Hgb urine dipstick SMALL (A) NEGATIVE   Bilirubin Urine NEGATIVE NEGATIVE   Ketones, ur NEGATIVE NEGATIVE mg/dL   Protein, ur NEGATIVE NEGATIVE mg/dL   Nitrite POSITIVE (A) NEGATIVE   Leukocytes,Ua LARGE (A) NEGATIVE   RBC / HPF 6-10 0 - 5 RBC/hpf   WBC, UA >50 (H) 0 - 5 WBC/hpf   Bacteria, UA RARE (A) NONE SEEN   Squamous Epithelial / LPF 0-5 0 - 5   WBC Clumps PRESENT    Mucus PRESENT    Hyaline Casts, UA PRESENT     Comment: Performed at [facility], [address] [place], [place] [address]  Troponin I - ONCE - STAT     Status: Abnormal   Collection Time: 05/04/18  3:17 PM  Result Value Ref Range   Troponin I 0.07 (HH) <0.03 ng/mL    Comment: CRITICAL RESULT CALLED TO, READ BACK BY AND VERIFIED WITH SHANNON MARTIN @ 4287 ON 05/04/18 BY JUW Performed at [facility], [address], [place],  [address]    Dg Chest 2 View  Result Date: 05/04/2018 CLINICAL DATA:  Supraventricular tachycardia. EXAM: CHEST - 2 VIEW COMPARISON:  12/10/2010, 03/27/2005. FINDINGS: Cardiac silhouette normal  in size, unchanged. Prior  TAVR. Thoracic aorta mildly atherosclerotic. Hilar and mediastinal contours otherwise unremarkable. Lungs clear. Bronchovascular markings normal. Pulmonary vascularity normal. No visible pleural effusions. No pneumothorax. Degenerative changes involving the visualized lumbar spine. IMPRESSION: No acute cardiopulmonary disease. Electronically Signed   By: Evangeline Dakin M.D.   On: 05/04/2018 13:03    Pending Labs Unresulted Labs (From admission, onward)    Start     Ordered   05/11/18 0500  Creatinine, serum  (enoxaparin (LOVENOX)    CrCl >/= 30 ml/min)  Weekly,   STAT    Comments:  while on enoxaparin therapy    05/04/18 1824   05/04/18 1825  CBC  (enoxaparin (LOVENOX)    CrCl >/= 30 ml/min)  Once,   STAT    Comments:  Baseline for enoxaparin therapy IF NOT ALREADY DRAWN.  Notify MD if PLT < 100 K.    05/04/18 1824   05/04/18 1825  Creatinine, serum  (enoxaparin (LOVENOX)    CrCl >/= 30 ml/min)  Once,   STAT    Comments:  Baseline for enoxaparin therapy IF NOT ALREADY DRAWN.    05/04/18 1824   05/04/18 1825  Troponin I - Now Then Q4H  Now then every 4 hours,   STAT     05/04/18 1824   05/04/18 1324  Urine culture  ONCE - STAT,   STAT     05/04/18 1324          Vitals/Pain Today's Vitals   05/04/18 1930 05/04/18 2000 05/04/18 2030 05/04/18 2036  BP: (!) 150/81 (!) 161/150 (!) 156/106   Pulse: 78 74 76   Resp: 17 13 15    Temp:      TempSrc:      SpO2: 99% 98% 96%   Weight:      Height:      PainSc:    0-No pain    Isolation Precautions No active isolations  Medications Medications  0.9 %  sodium chloride infusion (has no administration in time range)  aspirin EC tablet 81 mg (81 mg Oral Not Given 05/04/18 1853)  levothyroxine (SYNTHROID, LEVOTHROID) tablet 50 mcg (50 mcg Oral Not Given 05/04/18 1856)  ferrous gluconate (FERGON) tablet 324 mg (324 mg Oral Not Given 05/04/18 1855)  multivitamin with minerals tablet 1 tablet (1 tablet Oral Not  Given 05/04/18 1855)  vitamin A & D ointment 1 application (has no administration in time range)  acetaminophen (TYLENOL) tablet 650 mg (has no administration in time range)    Or  acetaminophen (TYLENOL) suppository 650 mg (has no administration in time range)  ondansetron (ZOFRAN) tablet 4 mg (has no administration in time range)    Or  ondansetron (ZOFRAN) injection 4 mg (has no administration in time range)  enoxaparin (LOVENOX) injection 40 mg (40 mg Subcutaneous Given 05/04/18 1941)  ciprofloxacin (CIPRO) tablet 250 mg (has no administration in time range)  atorvastatin (LIPITOR) tablet 10 mg (has no administration in time range)  aspirin chewable tablet 243 mg (243 mg Oral Given 05/04/18 1246)  cefTRIAXone (ROCEPHIN) 1 g in sodium chloride 0.9 % 100 mL IVPB (0 g Intravenous Stopped 05/04/18 1458)    Mobility: Ambulates with assistance

## 2018-05-04 NOTE — H&P (Addendum)
[facility] NAME: Olivia [name]    MR#:  [id number]  DATE OF BIRTH:  16-Feb-1926  DATE OF ADMISSION:  05/04/2018  PRIMARY CARE PHYSICIAN: Rusty Aus, MD   REQUESTING/REFERRING PHYSICIAN: Dr. Aundria Rud  CHIEF COMPLAINT:   Chief Complaint  Patient presents with  . Tachycardia    HISTORY OF PRESENT ILLNESS:  Olivia [name]  is a 85 y.o. female with a known history of hypertension, hyperlipidemia, history of aortic stenosis status post TAVR, history of skin cancer, hypothyroidism who presented to the hospital due to palpitations and noted to be in SVT.  Patient says she was in her usual state of health was feeling fine and when she went to take a shower this morning she felt different and felt like her heart was racing.  She had to sit down and she lives at [facility] and called staff and when the nurse came they checked her pulse and heart rate was significantly elevated.  EMS was called and she was brought to the hospital.  The EMS asked the patient to do some vagal maneuvers while helped controlling her heart rate and she is currently in a normal sinus rhythm.  She did not require any rate controlling medications.  She was going to be discharged home but her troponins were mildly elevated and therefore the ER physician spoke to her cardiologist who recommended observing the patient overnight in the hospital.  Patient denies any chest pains, shortness of breath, nausea, vomiting, dizziness, abdominal pain, dysuria hematuria or any other associated symptoms presently.  PAST MEDICAL HISTORY:   Past Medical History:  Diagnosis Date  . Acquired hypothyroidism 08/09/2015  . Aortic stenosis, moderate 02/07/2015   Stable 5/17, slightly worse 5/18  . Benign essential hypertension 08/09/2015  . Chicken pox   . Colon polyps   . Hyperlipidemia, mixed 08/09/2015  . Medicare annual wellness visit, initial 02/11/2016   12/17,5/18, 6/19   12/17,5/18  . SCC (squamous cell carcinoma), face 06/19/2016    PAST SURGICAL HISTORY:   Past Surgical History:  Procedure Laterality Date  . APPENDECTOMY    . AXILLARY LYMPH NODE BIOPSY     needle biopsy   . CHOLECYSTECTOMY    . MOHS SURGERY  07/2016   Dr. Manley Mason  . TONSILLECTOMY    . WISDOM TOOTH EXTRACTION      SOCIAL HISTORY:   Social History   Tobacco Use  . Smoking status: Never Smoker  . Smokeless tobacco: Never Used  Substance Use Topics  . Alcohol use: Yes    FAMILY HISTORY:   Family History  Problem Relation Age of Onset  . Hypertension Mother   . Prostate cancer Father   . Healthy Sister   . Aortic stenosis Brother     DRUG ALLERGIES:   Allergies  Allergen Reactions  . Ace Inhibitors     Other reaction(s): Cough  . Alendronate     Muscle pain  . Fentanyl Nausea And Vomiting  . Morphine Other (See Comments)    Other reaction(s): Muscle Pain    REVIEW OF SYSTEMS:   Review of Systems  Constitutional: Negative for chills, fever and weight loss.  HENT: Negative for congestion, nosebleeds and tinnitus.   Eyes: Negative for blurred vision, double vision and redness.  Respiratory: Negative for cough, hemoptysis, shortness of breath and wheezing.   Cardiovascular: Positive for palpitations. Negative for chest pain, orthopnea, leg swelling and PND.  Gastrointestinal: Negative for  abdominal pain, diarrhea, melena, nausea and vomiting.  Genitourinary: Negative for dysuria, hematuria and urgency.  Musculoskeletal: Negative for falls and joint pain.  Neurological: Negative for dizziness, tingling, sensory change, focal weakness, seizures, weakness and headaches.  Endo/Heme/Allergies: Negative for polydipsia. Does not bruise/bleed easily.  Psychiatric/Behavioral: Negative for depression and memory loss. The patient is not nervous/anxious.   All other systems reviewed and are negative.   MEDICATIONS AT HOME:   Prior to Admission medications     Medication Sig Start Date End Date Taking? Authorizing Provider  aspirin EC 81 MG tablet Take 81 mg by mouth daily.    Yes [provider]  atorvastatin (LIPITOR) 10 MG tablet Take 10 mg by mouth every other day.  09/18/16  Yes [provider]  clobetasol cream (TEMOVATE) 0.05 % APPLY TOPICALLY 3 TIMES A WEEK 02/12/17  Yes [provider]  Diaper Rash Products (A+D DIAPER RASH) CREA Apply topically daily.    Yes [provider]  ferrous gluconate (FERGON) 324 MG tablet Take 324 mg by mouth daily. 04/19/18 04/19/19 Yes [provider]  levothyroxine (SYNTHROID, LEVOTHROID) 50 MCG tablet TAKE ONE TABLET BY MOUTH DAILY 07/14/17  Yes [provider]  Multiple Vitamins-Minerals (MULTIVITAMIN ADULT) TABS Take 1 tablet by mouth daily.   Yes [provider]  Vitamins A & D (VITAMIN A & D) ointment Apply 1 application topically daily.   Yes [provider]  metoprolol succinate (TOPROL XL) 25 MG 24 hr tablet Take 1 tablet (25 mg total) by mouth daily. 05/04/18 05/04/19  Eula Listen, MD  sulfamethoxazole-trimethoprim (BACTRIM DS,SEPTRA DS) 800-160 MG tablet Take 1 tablet by mouth 2 (two) times daily for 7 days. 05/04/18 05/11/18  Eula Listen, MD      VITAL SIGNS:  Blood pressure (!) 113/97, pulse 81, temperature 97.9 F (36.6 C), temperature source Oral, resp. rate (!) 21, height 5\' 6"  (1.676 m), weight 75.8 kg, SpO2 97 %.  PHYSICAL EXAMINATION:  Physical Exam  GENERAL:  85 y.o.-year-old patient lying in the bed with no acute distress.  EYES: Pupils equal, round, reactive to light and accommodation. No scleral icterus. Extraocular muscles intact.  HEENT: Head atraumatic, normocephalic. Oropharynx and nasopharynx clear. No oropharyngeal erythema, moist oral mucosa  NECK:  Supple, no jugular venous distention. No thyroid enlargement, no tenderness.  LUNGS: Normal breath sounds bilaterally, no wheezing, rales, rhonchi. No  use of accessory muscles of respiration.  CARDIOVASCULAR: S1, S2 RRR. II/VI SEM at RSB, No rubs, gallops, clicks.  ABDOMEN: Soft, nontender, nondistended. Bowel sounds present. No organomegaly or mass.  EXTREMITIES: No pedal edema, cyanosis, or clubbing. + 2 pedal & radial pulses b/l.   NEUROLOGIC: Cranial nerves II through XII are intact. No focal Motor or sensory deficits appreciated b/l PSYCHIATRIC: The patient is alert and oriented x 3.  SKIN: No obvious rash, lesion, or ulcer.   LABORATORY PANEL:   CBC Recent Labs  Lab 05/04/18 1203  WBC 11.5*  HGB 11.3*  HCT 34.7*  PLT 550*   ------------------------------------------------------------------------------------------------------------------  Chemistries  Recent Labs  Lab 05/04/18 1203  NA 139  K 4.0  CL 106  CO2 24  GLUCOSE 99  BUN 22  CREATININE 1.08*  CALCIUM 9.2  MG 2.2   ------------------------------------------------------------------------------------------------------------------  Cardiac Enzymes Recent Labs  Lab 05/04/18 1517  TROPONINI 0.07*   ------------------------------------------------------------------------------------------------------------------  RADIOLOGY:  Dg Chest 2 View  Result Date: 05/04/2018 CLINICAL DATA:  Supraventricular tachycardia. EXAM: CHEST - 2 VIEW COMPARISON:  12/10/2010, 03/27/2005. FINDINGS: Cardiac silhouette  normal in size, unchanged. Prior TAVR. Thoracic aorta mildly atherosclerotic. Hilar and mediastinal contours otherwise unremarkable. Lungs clear. Bronchovascular markings normal. Pulmonary vascularity normal. No visible pleural effusions. No pneumothorax. Degenerative changes involving the visualized lumbar spine. IMPRESSION: No acute cardiopulmonary disease. Electronically Signed   By: Evangeline Dakin M.D.   On: 05/04/2018 13:03     IMPRESSION AND PLAN:   Avaleigh Decuir  is a 85 y.o. female with a known history of hypertension, hyperlipidemia, history of aortic  stenosis status post TAVR, history of skin cancer, hypothyroidism who presented to the hospital due to palpitations and noted to be in SVT.   1.  SVT- patient was noted to have palpitations and noted to be in supraventricular tachycardia.  This improved and resolved with just vagal maneuvers.  She is now currently in normal sinus rhythm and stable. -Electrolytes are stable.  Will observe on telemetry.  2.  Elevated troponin- suspected be secondary to supply demand ischemia from the SVT. -We will observe on telemetry, cycle cardiac markers will get cardiology consult.  Message was sent to Dr. Nehemiah Massed.  3.  Hypothyroidism-continue Synthroid.  4.  Hyperlipidemia-continue atorvastatin.  5.  History of aortic stenosis status post TAVR-patient follows up with cardiologist at [facility].  No acute issue related to this presently.  6. UTI - based off the UA on admission. Pt. Is asymptomatic.  - given IV Ceftriaxone in the ER.  Previous Urine culture grew Pseudomonas and it was pansensitive.  I will place the patient on oral Cipro for 3 days.    All the records are reviewed and case discussed with ED provider. Management plans discussed with the patient, family and they are in agreement.  CODE STATUS: Full code  TOTAL TIME TAKING CARE OF THIS PATIENT: 40 minutes.    Henreitta Leber M.D on 05/04/2018 at 5:16 PM  Between 7am to 6pm - Pager - [phone]  After 6pm go to [email or web address] - password [email or web address] [facility]  Office  [phone]  CC: Primary care physician; Rusty Aus, MD

## 2018-05-05 LAB — TROPONIN I: Troponin I: 0.1 ng/mL (ref <0.03)

## 2018-05-05 MED ORDER — LEVOTHYROXINE SODIUM 50 MCG PO TABS
50.0000 ug | ORAL_TABLET | Freq: Every day | ORAL | Status: DC
  Administered 2018-05-05: 50 ug via ORAL
  Filled 2018-05-05: qty 1

## 2018-05-05 MED ORDER — APIXABAN 5 MG PO TABS
5.0000 mg | ORAL_TABLET | Freq: Two times a day (BID) | ORAL | Status: DC
  Administered 2018-05-05: 5 mg via ORAL
  Filled 2018-05-05: qty 1

## 2018-05-05 MED ORDER — APIXABAN 5 MG PO TABS: 5.0000 mg | ORAL_TABLET | Freq: Two times a day (BID) | ORAL | 0 refills | Status: DC

## 2018-05-05 NOTE — Discharge Summary (Signed)
[facility] [place] [place] NAME: Olivia [name]    MR#:  [id number]  DATE OF BIRTH:  11/06/1925  DATE OF ADMISSION:  05/04/2018 ADMITTING PHYSICIAN: Henreitta Leber, MD  DATE OF DISCHARGE: 05/05/2018 11:40 AM  PRIMARY CARE PHYSICIAN: Rusty Aus, MD   ADMISSION DIAGNOSIS:  SVT (supraventricular tachycardia) (HCC) [I47.1] Elevated troponin [R79.89] Urinary tract infection without hematuria, site unspecified [N39.0]  DISCHARGE DIAGNOSIS:  Active Problems:   SVT (supraventricular tachycardia) (HCC) Urinary tract infection Elevated troponin secondary to SVT and demand ischemia  SECONDARY DIAGNOSIS:   Past Medical History:  Diagnosis Date  . Acquired hypothyroidism 08/09/2015  . Aortic stenosis, moderate 02/07/2015   Stable 5/17, slightly worse 5/18  . Benign essential hypertension 08/09/2015  . Chicken pox   . Colon polyps   . Hyperlipidemia, mixed 08/09/2015  . Medicare annual wellness visit, initial 02/11/2016   12/17,5/18, 6/19  12/17,5/18  . SCC (squamous cell carcinoma), face 06/19/2016     ADMITTING HISTORY Olivia [name]  is a 85 y.o. female with a known history of hypertension, hyperlipidemia, history of aortic stenosis status post TAVR, history of skin cancer, hypothyroidism who presented to the hospital due to palpitations and noted to be in SVT.  Patient says she was in her usual state of health was feeling fine and when she went to take a shower this morning she felt different and felt like her heart was racing.  She had to sit down and she lives at [facility] and called staff and when the nurse came they checked her pulse and heart rate was significantly elevated.  EMS was called and she was brought to the hospital.  The EMS asked the patient to do some vagal maneuvers while helped controlling her heart rate and she is currently in a normal sinus rhythm.  She did not require any rate controlling medications.  She was going to be  discharged home but her troponins were mildly elevated and therefore the ER physician spoke to her cardiologist who recommended observing the patient overnight in the hospital.  Patient denies any chest pains, shortness of breath, nausea, vomiting, dizziness, abdominal pain, dysuria hematuria or any other associated symptoms presently.  HOSPITAL COURSE:  Patient was admitted to telemetry.  [facility] clinic cardiology evaluated the patient.  Patient's SVT improved and resolved with vagal maneuvers.  He was started on oral metoprolol for rate control.  Elevated troponin secondary to demand ischemia from SVT.  Cardiology did not recommend any acute intervention.  Patient started on oral Eliquis for anticoagulation.  Patient has history of aortic stenosis status post TAVR.  Patient on oral Bactrim antibiotic as outpatient for UTI and will continue it.  CONSULTS OBTAINED:  Cardiology consult with [facility] clinic cardiology DRUG ALLERGIES:   Allergies  Allergen Reactions  . Ace Inhibitors     Other reaction(s): Cough  . Alendronate     Muscle pain  . Fentanyl Nausea And Vomiting  . Morphine Other (See Comments)    Other reaction(s): Muscle Pain    DISCHARGE MEDICATIONS:   Allergies as of 05/05/2018      Reactions   Ace Inhibitors    Other reaction(s): Cough   Alendronate    Muscle pain   Fentanyl Nausea And Vomiting   Morphine Other (See Comments)   Other reaction(s): Muscle Pain      Medication List    STOP taking these medications   aspirin EC 81 MG tablet  TAKE these medications   A+D DIAPER RASH Crea Apply topically daily.   apixaban 5 MG Tabs tablet Commonly known as:  ELIQUIS Take 1 tablet (5 mg total) by mouth 2 (two) times daily for 30 days.   atorvastatin 10 MG tablet Commonly known as:  LIPITOR Take 10 mg by mouth every other day.   clobetasol cream 0.05 % Commonly known as:  TEMOVATE APPLY TOPICALLY 3 TIMES A WEEK   ferrous gluconate 324 MG  tablet Commonly known as:  FERGON Take 324 mg by mouth daily.   levothyroxine 50 MCG tablet Commonly known as:  SYNTHROID, LEVOTHROID TAKE ONE TABLET BY MOUTH DAILY   metoprolol succinate 25 MG 24 hr tablet Commonly known as:  TOPROL XL Take 1 tablet (25 mg total) by mouth daily.   MULTIVITAMIN ADULT Tabs Take 1 tablet by mouth daily.   sulfamethoxazole-trimethoprim 800-160 MG tablet Commonly known as:  BACTRIM DS,SEPTRA DS Take 1 tablet by mouth 2 (two) times daily for 7 days.   vitamin A & D ointment Apply 1 application topically daily.       Today  Patient seen today No palpitations No chest pain No shortness of breath Hemodynamically stable  VITAL SIGNS:  Blood pressure (!) 151/75, pulse 80, temperature (!) 97.5 F (36.4 C), temperature source Oral, resp. rate 20, height 5\' 6"  (1.676 m), weight 76.6 kg, SpO2 96 %.  I/O:    Intake/Output Summary (Last 24 hours) at 05/05/2018 1314 Last data filed at 05/05/2018 0900 Gross per 24 hour  Intake -  Output 801 ml  Net -801 ml    PHYSICAL EXAMINATION:  Physical Exam  GENERAL:  85 y.o.-year-old patient lying in the bed with no acute distress.  LUNGS: Normal breath sounds bilaterally, no wheezing, rales,rhonchi or crepitation. No use of accessory muscles of respiration.  CARDIOVASCULAR: S1, S2 normal. No murmurs, rubs, or gallops.  ABDOMEN: Soft, non-tender, non-distended. Bowel sounds present. No organomegaly or mass.  NEUROLOGIC: Moves all 4 extremities. PSYCHIATRIC: The patient is alert and oriented x 3.  SKIN: No obvious rash, lesion, or ulcer.   DATA REVIEW:   CBC Recent Labs  Lab 05/04/18 1203  WBC 11.5*  HGB 11.3*  HCT 34.7*  PLT 550*    Chemistries  Recent Labs  Lab 05/04/18 1203  NA 139  K 4.0  CL 106  CO2 24  GLUCOSE 99  BUN 22  CREATININE 1.08*  CALCIUM 9.2  MG 2.2    Cardiac Enzymes Recent Labs  Lab 05/05/18 0241  TROPONINI 0.10*    Microbiology Results  Results for  orders placed or performed during the hospital encounter of 05/04/18  MRSA PCR Screening     Status: None   Collection Time: 05/04/18  9:10 PM  Result Value Ref Range Status   MRSA by PCR NEGATIVE NEGATIVE Final    Comment:        The GeneXpert MRSA Assay (FDA approved for NASAL specimens only), is one component of a comprehensive MRSA colonization surveillance program. It is not intended to diagnose MRSA infection nor to guide or monitor treatment for MRSA infections. Performed at [facility], [address], [place], [place] [address]     RADIOLOGY:  Dg Chest 2 View  Result Date: 05/04/2018 CLINICAL DATA:  Supraventricular tachycardia. EXAM: CHEST - 2 VIEW COMPARISON:  12/10/2010, 03/27/2005. FINDINGS: Cardiac silhouette normal in size, unchanged. Prior TAVR. Thoracic aorta mildly atherosclerotic. Hilar and mediastinal contours otherwise unremarkable. Lungs clear. Bronchovascular markings normal. Pulmonary vascularity  normal. No visible pleural effusions. No pneumothorax. Degenerative changes involving the visualized lumbar spine. IMPRESSION: No acute cardiopulmonary disease. Electronically Signed   By: Evangeline Dakin M.D.   On: 05/04/2018 13:03    Follow up with PCP in 1 week.  Management plans discussed with the patient, family and they are in agreement.  CODE STATUS: Full code    Code Status Orders  (From admission, onward)         Start     Ordered   05/04/18 1825  Full code  Continuous     05/04/18 1824        Code Status History    This patient has a current code status but no historical code status.    Advance Directive Documentation     Most Recent Value  Type of Advance Directive  Healthcare Power of Attorney  Pre-existing out of facility DNR order (yellow form or pink MOST form)  -  "MOST" Form in Place?  -      TOTAL TIME TAKING CARE OF THIS PATIENT ON DAY OF DISCHARGE: more than 35 minutes.   Saundra Shelling M.D on 05/05/2018 at 1:14  PM  Between 7am to 6pm - Pager - [phone]  After 6pm go to [email or web address] - password [email or web address] [facility]  Office  [phone]  CC: Primary care physician; Rusty Aus, MD  Note: This dictation was prepared with Dragon dictation along with smaller phrase technology. Any transcriptional errors that result from this process are unintentional.

## 2018-05-05 NOTE — Plan of Care (Signed)
SR maintained 60-70s on telemetry this shift.

## 2018-05-05 NOTE — Progress Notes (Signed)
ANTICOAGULATION CONSULT NOTE - Initial Consult  Pharmacy Consult for apixaban Indication: atrial fibrillation  Allergies  Allergen Reactions  . Ace Inhibitors     Other reaction(s): Cough  . Alendronate     Muscle pain  . Fentanyl Nausea And Vomiting  . Morphine Other (See Comments)    Other reaction(s): Muscle Pain    Patient Measurements: Height: 5\' 6"  (167.6 cm) Weight: 168 lb 12.8 oz (76.6 kg) IBW/kg (Calculated) : 59.3  Vital Signs: Temp: 97.5 F (36.4 C) (02/26 0825) Temp Source: Oral (02/26 0825) BP: 151/75 (02/26 0825) Pulse Rate: 80 (02/26 0825)  Labs: Recent Labs    05/04/18 1203 05/04/18 1517 05/04/18 2124 05/05/18 0241  HGB 11.3*  --   --   --   HCT 34.7*  --   --   --   PLT 550*  --   --   --   APTT <24*  --   --   --   LABPROT 11.7  --   --   --   INR 0.9  --   --   --   CREATININE 1.08*  --   --   --   TROPONINI <0.03 0.07* 0.11* 0.10*    Estimated Creatinine Clearance: 36.2 mL/min (A) (by C-G formula based on SCr of 1.08 mg/dL (H)).   Medical History: Past Medical History:  Diagnosis Date  . Acquired hypothyroidism 08/09/2015  . Aortic stenosis, moderate 02/07/2015   Stable 5/17, slightly worse 5/18  . Benign essential hypertension 08/09/2015  . Chicken pox   . Colon polyps   . Hyperlipidemia, mixed 08/09/2015  . Medicare annual wellness visit, initial 02/11/2016   12/17,5/18, 6/19  12/17,5/18  . SCC (squamous cell carcinoma), face 06/19/2016    Medications:  Medications Prior to Admission  Medication Sig Dispense Refill Last Dose  . aspirin EC 81 MG tablet Take 81 mg by mouth daily.    05/04/2018 at 0800  . atorvastatin (LIPITOR) 10 MG tablet Take 10 mg by mouth every other day.    05/03/2018 at 1800  . clobetasol cream (TEMOVATE) 0.05 % APPLY TOPICALLY 3 TIMES A WEEK   Past Week at Unknown time  . Diaper Rash Products (A+D DIAPER RASH) CREA Apply topically daily.    05/04/2018 at 0800  . ferrous gluconate (FERGON) 324 MG tablet Take 324 mg  by mouth daily.   05/04/2018 at 0800  . levothyroxine (SYNTHROID, LEVOTHROID) 50 MCG tablet TAKE ONE TABLET BY MOUTH DAILY   05/04/2018 at 0500  . Multiple Vitamins-Minerals (MULTIVITAMIN ADULT) TABS Take 1 tablet by mouth daily.   05/04/2018 at 0800  . Vitamins A & D (VITAMIN A & D) ointment Apply 1 application topically daily.   05/04/2018 at 0800   Scheduled:  . apixaban  5 mg Oral BID  . aspirin EC  81 mg Oral Daily  . atorvastatin  10 mg Oral QODAY  . ciprofloxacin  250 mg Oral BID  . ferrous gluconate  324 mg Oral Daily  . levothyroxine  50 mcg Oral Q0600  . multivitamin with minerals  1 tablet Oral Daily  . pneumococcal 23 valent vaccine  0.5 mL Intramuscular Tomorrow-1000  . sodium chloride flush  3 mL Intravenous Q12H  . vitamin A & D  1 application Topical Daily   Infusions:  . sodium chloride     PRN: sodium chloride, acetaminophen **OR** acetaminophen, ondansetron **OR** ondansetron (ZOFRAN) IV  Assessment: Pharmacy consulted to start apixaban. Was on enoxaparin for DVT ppx,  which is now d/c'ed .  Goal of Therapy:  Monitor platelets by anticoagulation protocol: Yes   Plan:  Will start apixaban 5 mg BID. Pt is > 18 years old but Scr is < 1.5 and weight > 60 kg- no need for dose reduction at this time.   Oswald Hillock, PharmD, BCPS Clinical Pharmacist  05/05/2018,9:30 AM

## 2018-05-05 NOTE — Progress Notes (Signed)
Patient alert and oriented, vss, no complaints of pain.  Escorted out of hospital via wheelchair by volunteers.

## 2018-05-05 NOTE — Progress Notes (Signed)
[facility] Cardiology [facility] Encounter Note  Patient: Olivia [name] / Admit Date: 05/04/2018 / Date of Encounter: 05/05/2018, 9:01 AM   Subjective: Patient feels well today.  No evidence of significant new rhythm disturbances atrial fibrillation chest pain shortness of breath or congestive heart failure Minimal elevation of troponin most consistent with demand ischemia Telemetry showing normal sinus rhythm since admission and tolerating reduced dose of metoprolol  Review of Systems: Positive for: None Negative for: Vision change, hearing change, syncope, dizziness, nausea, vomiting,diarrhea, bloody stool, stomach pain, cough, congestion, diaphoresis, urinary frequency, urinary pain,skin lesions, skin rashes Others previously listed  Objective: Telemetry: Normal sinus rhythm Physical Exam: Blood pressure (!) 151/75, pulse 80, temperature (!) 97.5 F (36.4 C), temperature source Oral, resp. rate 20, height 5\' 6"  (1.676 m), weight 76.6 kg, SpO2 96 %. Body mass index is 27.25 kg/m. General: Well developed, well nourished, in no acute distress. Head: Normocephalic, atraumatic, sclera non-icteric, no xanthomas, nares are without discharge. Neck: No apparent masses Lungs: Normal respirations with no wheezes, no rhonchi, no rales , no crackles   Heart: Regular rate and rhythm, normal S1 S2, 2-3+ right upper sternal border murmur, no rub, no gallop, PMI is normal size and placement, carotid upstroke normal without bruit, jugular venous pressure normal Abdomen: Soft, non-tender, non-distended with normoactive bowel sounds. No hepatosplenomegaly. Abdominal aorta is normal size without bruit Extremities: No edema, no clubbing, no cyanosis, no ulcers,  Peripheral: 2+ radial, 2+ femoral, 2+ dorsal pedal pulses Neuro: Alert and oriented. Moves all extremities spontaneously. Psych:  Responds to questions appropriately with a normal affect.   Intake/Output Summary (Last 24 hours) at 05/05/2018  0901 Last data filed at 05/05/2018 0700 Gross per 24 hour  Intake -  Output 600 ml  Net -600 ml    Inpatient Medications:  . aspirin EC  81 mg Oral Daily  . atorvastatin  10 mg Oral QODAY  . ciprofloxacin  250 mg Oral BID  . enoxaparin (LOVENOX) injection  40 mg Subcutaneous Q24H  . ferrous gluconate  324 mg Oral Daily  . levothyroxine  50 mcg Oral Q0600  . multivitamin with minerals  1 tablet Oral Daily  . pneumococcal 23 valent vaccine  0.5 mL Intramuscular Tomorrow-1000  . sodium chloride flush  3 mL Intravenous Q12H  . vitamin A & D  1 application Topical Daily   Infusions:  . sodium chloride      Labs: Recent Labs    05/04/18 1203  NA 139  K 4.0  CL 106  CO2 24  GLUCOSE 99  BUN 22  CREATININE 1.08*  CALCIUM 9.2  MG 2.2   No results for input(s): AST, ALT, ALKPHOS, BILITOT, PROT, ALBUMIN in the last 72 hours. Recent Labs    05/04/18 1203  WBC 11.5*  HGB 11.3*  HCT 34.7*  MCV 98.9  PLT 550*   Recent Labs    05/04/18 1203 05/04/18 1517 05/04/18 2124 05/05/18 0241  TROPONINI <0.03 0.07* 0.11* 0.10*   Invalid input(s): POCBNP No results for input(s): HGBA1C in the last 72 hours.   Weights: Filed Weights   05/04/18 1205 05/04/18 2107 05/05/18 0353  Weight: 75.8 kg 76.5 kg 76.6 kg     Radiology/Studies:  Dg Chest 2 View  Result Date: 05/04/2018 CLINICAL DATA:  Supraventricular tachycardia. EXAM: CHEST - 2 VIEW COMPARISON:  12/10/2010, 03/27/2005. FINDINGS: Cardiac silhouette normal in size, unchanged. Prior TAVR. Thoracic aorta mildly atherosclerotic. Hilar and mediastinal contours otherwise unremarkable. Lungs clear. Bronchovascular markings normal. Pulmonary  vascularity normal. No visible pleural effusions. No pneumothorax. Degenerative changes involving the visualized lumbar spine. IMPRESSION: No acute cardiopulmonary disease. Electronically Signed   By: Evangeline Dakin M.D.   On: 05/04/2018 13:03     Assessment and Recommendation  85 y.o.  female with known TAVR and previous heart block status post pacemaker placement with hyperlipidemia but no coronary atherosclerosis by cardiac catheterization in recent months having acute onset of dizziness weakness consistent with atrial fibrillation with rapid ventricular rate now converted to normal sinus rhythm without evidence of myocardial infarction or congestive heart failure with elevated troponin consistent with demand ischemia 1.  Metoprolol 25 mg each day for maintenance of normal sinus rhythm 2.  Anticoagulation with Eliquis for further risk reduction of stroke with recurrent atrial fibrillation 3.  Begin ambulation and follow for improvements of symptoms and okay for discharge home from cardiac standpoint with follow-up next week for further adjustments of medication management 4.  No further cardiac diagnostics necessary at this time  Signed, Serafina Royals M.D. FACC

## 2018-05-05 NOTE — Care Management Note (Signed)
Case Management Note  Patient Details  Name: Olivia [name] MRN: [id number] Date of Birth: [birth year]  Subjective/Objective:     Patient is from home alone.  She lives at the [facility] independent living.   Independent in all ADL's.  Obtains prescriptions at [facility] without difficulty.  Current with Dr. Sabra Heck.  Will be starting on Eliquis.  She has prescription coverage.   This RNCM provided patient with Eliquis 30 day free coupon.  Discharging to home today.  Son is transporting her home.  No further needs identified at this time.           Action/Plan:   Expected Discharge Date:  05/05/18               Expected Discharge Plan:  Home/Self Care  In-House Referral:     Discharge planning Services  CM Consult  Post Acute Care Choice:    Choice offered to:     DME Arranged:    DME Agency:     HH Arranged:    HH Agency:     Status of Service:  Completed, signed off  If discussed at [employer] of Stay Meetings, dates discussed:    Additional Comments:  Elza Rafter, RN 05/05/2018, 10:14 AM

## 2018-05-08 LAB — URINE CULTURE: Culture: >=100000 — AB

## 2018-05-09 DIAGNOSIS — Z8719 Personal history of other diseases of the digestive system: Secondary | ICD-10-CM

## 2018-05-09 HISTORY — DX: Personal history of other diseases of the digestive system: Z87.19

## 2018-05-10 ENCOUNTER — Inpatient Hospital Stay
Admission: EM | Admit: 2018-05-10 | Discharge: 2018-05-13 | DRG: 378 | Disposition: A | Discharge status: Skilled Nursing Facility | Payer: Medicare Other | Attending: Internal Medicine | Admitting: Internal Medicine

## 2018-05-10 ENCOUNTER — Other Ambulatory Visit: Payer: Self-pay

## 2018-05-10 ENCOUNTER — Encounter: Payer: Self-pay | Admitting: Emergency Medicine

## 2018-05-10 DIAGNOSIS — I480 Paroxysmal atrial fibrillation: Secondary | ICD-10-CM | POA: Diagnosis present

## 2018-05-10 DIAGNOSIS — Z952 Presence of prosthetic heart valve: Secondary | ICD-10-CM

## 2018-05-10 DIAGNOSIS — Z8042 Family history of malignant neoplasm of prostate: Secondary | ICD-10-CM | POA: Diagnosis not present

## 2018-05-10 DIAGNOSIS — Z888 Allergy status to other drugs, medicaments and biological substances status: Secondary | ICD-10-CM | POA: Diagnosis not present

## 2018-05-10 DIAGNOSIS — E669 Obesity, unspecified: Secondary | ICD-10-CM | POA: Diagnosis present

## 2018-05-10 DIAGNOSIS — Z79899 Other long term (current) drug therapy: Secondary | ICD-10-CM | POA: Diagnosis not present

## 2018-05-10 DIAGNOSIS — Z7901 Long term (current) use of anticoagulants: Secondary | ICD-10-CM

## 2018-05-10 DIAGNOSIS — Z79890 Hormone replacement therapy (postmenopausal): Secondary | ICD-10-CM | POA: Diagnosis not present

## 2018-05-10 DIAGNOSIS — E782 Mixed hyperlipidemia: Secondary | ICD-10-CM | POA: Diagnosis present

## 2018-05-10 DIAGNOSIS — I10 Essential (primary) hypertension: Secondary | ICD-10-CM | POA: Diagnosis present

## 2018-05-10 DIAGNOSIS — C3490 Malignant neoplasm of unspecified part of unspecified bronchus or lung: Secondary | ICD-10-CM | POA: Diagnosis present

## 2018-05-10 DIAGNOSIS — K449 Diaphragmatic hernia without obstruction or gangrene: Secondary | ICD-10-CM | POA: Diagnosis not present

## 2018-05-10 DIAGNOSIS — Z885 Allergy status to narcotic agent status: Secondary | ICD-10-CM

## 2018-05-10 DIAGNOSIS — K5731 Diverticulosis of large intestine without perforation or abscess with bleeding: Secondary | ICD-10-CM | POA: Diagnosis present

## 2018-05-10 DIAGNOSIS — E039 Hypothyroidism, unspecified: Secondary | ICD-10-CM | POA: Diagnosis present

## 2018-05-10 DIAGNOSIS — D62 Acute posthemorrhagic anemia: Secondary | ICD-10-CM | POA: Diagnosis present

## 2018-05-10 DIAGNOSIS — Z8249 Family history of ischemic heart disease and other diseases of the circulatory system: Secondary | ICD-10-CM

## 2018-05-10 DIAGNOSIS — Z66 Do not resuscitate: Secondary | ICD-10-CM | POA: Diagnosis not present

## 2018-05-10 DIAGNOSIS — K921 Melena: Secondary | ICD-10-CM | POA: Diagnosis not present

## 2018-05-10 DIAGNOSIS — K922 Gastrointestinal hemorrhage, unspecified: Secondary | ICD-10-CM | POA: Diagnosis present

## 2018-05-10 HISTORY — DX: Unspecified atrial fibrillation: I48.91

## 2018-05-10 LAB — CBC WITH DIFFERENTIAL/PLATELET
Abs Immature Granulocytes: 0.06 10*3/uL (ref 0.00–0.07)
Basophils Absolute: 0 10*3/uL (ref 0.0–0.1)
Basophils Relative: 0 %
Eosinophils Absolute: 0.1 10*3/uL (ref 0.0–0.5)
Eosinophils Relative: 1 %
HCT: 30.8 % — ABNORMAL LOW (ref 36.0–46.0)
HEMOGLOBIN: 10 g/dL — AB (ref 12.0–15.0)
Immature Granulocytes: 1 %
LYMPHS PCT: 24 %
Lymphs Abs: 2.3 10*3/uL (ref 0.7–4.0)
MCH: 31.7 pg (ref 26.0–34.0)
MCHC: 32.5 g/dL (ref 30.0–36.0)
MCV: 97.8 fL (ref 80.0–100.0)
Monocytes Absolute: 0.8 10*3/uL (ref 0.1–1.0)
Monocytes Relative: 9 %
NEUTROS ABS: 6.1 10*3/uL (ref 1.7–7.7)
Neutrophils Relative %: 65 %
Platelets: 500 10*3/uL — ABNORMAL HIGH (ref 150–400)
RBC: 3.15 MIL/uL — ABNORMAL LOW (ref 3.87–5.11)
RDW: 13.9 % (ref 11.5–15.5)
WBC: 9.4 10*3/uL (ref 4.0–10.5)
nRBC: 0.7 % — ABNORMAL HIGH (ref 0.0–0.2)

## 2018-05-10 LAB — PROTIME-INR
INR: 1.2 (ref 0.8–1.2)
Prothrombin Time: 15.1 seconds (ref 11.4–15.2)

## 2018-05-10 LAB — BASIC METABOLIC PANEL
Anion gap: 11 (ref 5–15)
BUN: 34 mg/dL — ABNORMAL HIGH (ref 8–23)
CO2: 20 mmol/L — AB (ref 22–32)
Calcium: 8.7 mg/dL — ABNORMAL LOW (ref 8.9–10.3)
Chloride: 102 mmol/L (ref 98–111)
Creatinine, Ser: 1.59 mg/dL — ABNORMAL HIGH (ref 0.44–1.00)
GFR calc Af Amer: 33 mL/min — ABNORMAL LOW (ref >60)
GFR calc non Af Amer: 28 mL/min — ABNORMAL LOW (ref >60)
Glucose, Bld: 135 mg/dL — ABNORMAL HIGH (ref 70–99)
POTASSIUM: 4.4 mmol/L (ref 3.5–5.1)
Sodium: 133 mmol/L — ABNORMAL LOW (ref 135–145)

## 2018-05-10 LAB — LACTIC ACID, PLASMA
Lactic Acid, Venous: 2.3 mmol/L (ref 0.5–1.9)
Lactic Acid, Venous: 3 mmol/L (ref 0.5–1.9)

## 2018-05-10 LAB — RETICULOCYTES
Immature Retic Fract: 13.8 % (ref 2.3–15.9)
RBC.: 2.8 MIL/uL — ABNORMAL LOW (ref 3.87–5.11)
Retic Count, Absolute: 59.6 10*3/uL (ref 19.0–186.0)
Retic Ct Pct: 2.1 % (ref 0.4–3.1)

## 2018-05-10 LAB — TROPONIN I: Troponin I: <0.03 ng/mL (ref <0.03)

## 2018-05-10 LAB — HEMOGLOBIN: Hemoglobin: 9 g/dL — ABNORMAL LOW (ref 12.0–15.0)

## 2018-05-10 MED ORDER — ONDANSETRON HCL 4 MG/2ML IJ SOLN: 4.0000 mg | Freq: Four times a day (QID) | INTRAMUSCULAR | Status: DC | PRN

## 2018-05-10 MED ORDER — FERROUS GLUCONATE 324 (38 FE) MG PO TABS
324.0000 mg | ORAL_TABLET | Freq: Every day | ORAL | Status: DC
  Administered 2018-05-13: 324 mg via ORAL
  Filled 2018-05-10 (×3): qty 1

## 2018-05-10 MED ORDER — SODIUM CHLORIDE 0.9 % IV BOLUS
500.0000 mL | Freq: Once | INTRAVENOUS | Status: AC
  Administered 2018-05-10: 500 mL via INTRAVENOUS

## 2018-05-10 MED ORDER — ACETAMINOPHEN 650 MG RE SUPP: 650.0000 mg | Freq: Four times a day (QID) | RECTAL | Status: DC | PRN

## 2018-05-10 MED ORDER — LEVOTHYROXINE SODIUM 50 MCG PO TABS
50.0000 ug | ORAL_TABLET | Freq: Every day | ORAL | Status: DC
  Administered 2018-05-13: 50 ug via ORAL
  Filled 2018-05-10: qty 1

## 2018-05-10 MED ORDER — ADULT MULTIVITAMIN W/MINERALS CH
1.0000 | ORAL_TABLET | Freq: Every day | ORAL | Status: DC
  Administered 2018-05-13: 1 via ORAL
  Filled 2018-05-10: qty 1

## 2018-05-10 MED ORDER — PANTOPRAZOLE SODIUM 40 MG IV SOLR
40.0000 mg | Freq: Two times a day (BID) | INTRAVENOUS | Status: DC
  Administered 2018-05-10 – 2018-05-13 (×6): 40 mg via INTRAVENOUS
  Filled 2018-05-10 (×6): qty 40

## 2018-05-10 MED ORDER — A+D DIAPER RASH EX CREA: TOPICAL_CREAM | Freq: Every day | CUTANEOUS | Status: DC

## 2018-05-10 MED ORDER — ONDANSETRON HCL 4 MG PO TABS: 4.0000 mg | ORAL_TABLET | Freq: Four times a day (QID) | ORAL | Status: DC | PRN

## 2018-05-10 MED ORDER — METOPROLOL SUCCINATE ER 25 MG PO TB24
25.0000 mg | ORAL_TABLET | Freq: Every day | ORAL | Status: DC
  Administered 2018-05-13: 25 mg via ORAL
  Filled 2018-05-10: qty 1

## 2018-05-10 MED ORDER — ACETAMINOPHEN 325 MG PO TABS: 650.0000 mg | ORAL_TABLET | Freq: Four times a day (QID) | ORAL | Status: DC | PRN

## 2018-05-10 MED ORDER — SODIUM CHLORIDE 0.9 % IV SOLN
INTRAVENOUS | Status: DC
  Administered 2018-05-10 – 2018-05-13 (×3): via INTRAVENOUS

## 2018-05-10 MED ORDER — PANTOPRAZOLE SODIUM 40 MG IV SOLR: 40.0000 mg | Freq: Once | INTRAVENOUS | Status: DC

## 2018-05-10 MED ORDER — VITAMINS A & D EX OINT
1.0000 "application " | TOPICAL_OINTMENT | Freq: Every day | CUTANEOUS | Status: DC | PRN
  Filled 2018-05-10: qty 113

## 2018-05-10 MED ORDER — BISACODYL 5 MG PO TBEC: 5.0000 mg | DELAYED_RELEASE_TABLET | Freq: Every day | ORAL | Status: DC | PRN

## 2018-05-10 MED ORDER — HYDROCODONE-ACETAMINOPHEN 5-325 MG PO TABS: 1.0000 | ORAL_TABLET | ORAL | Status: DC | PRN

## 2018-05-10 MED ORDER — FUROSEMIDE 20 MG PO TABS: 20.0000 mg | ORAL_TABLET | Freq: Every day | ORAL | Status: DC

## 2018-05-10 MED ORDER — POLYETHYLENE GLYCOL 3350 17 G PO PACK: 17.0000 g | PACK | Freq: Every day | ORAL | Status: DC | PRN

## 2018-05-10 MED ORDER — ALBUTEROL SULFATE (2.5 MG/3ML) 0.083% IN NEBU: 2.5000 mg | INHALATION_SOLUTION | Freq: Four times a day (QID) | RESPIRATORY_TRACT | Status: DC | PRN

## 2018-05-10 NOTE — ED Provider Notes (Signed)
[facility] Emergency Department Provider Note ____________________________________________   First MD Initiated Contact with Patient 05/10/18 1745     (approximate)  I have reviewed the triage vital signs and the nursing notes.   HISTORY  Chief Complaint Rectal Bleeding    HPI Olivia [name] is a 85 y.o. female with PMH as noted below including aortic valve replacement on Xarelto who presents with rectal bleeding, acute onset today, and occurring 3 times.  Patient reports feeling lightheaded earlier although this has improved.  She denies abdominal pain and has had no vomiting or hematemesis.  She states that she had an episode of GI bleeding one time many years ago after she ate some kind of seed.  She states that her last colonoscopy was many years ago as well.  She has no known history of ulcers or diverticulitis.  She has had no other abnormal bleeding or bruising.  Her cardiologist told her to discontinue her Eliquis today after she reported this bleeding, although she took her last dose this morning.   Past Medical History:  Diagnosis Date  . Acquired hypothyroidism 08/09/2015  . Aortic stenosis, moderate 02/07/2015   Stable 5/17, slightly worse 5/18  . Benign essential hypertension 08/09/2015  . Chicken pox   . Colon polyps   . Hyperlipidemia, mixed 08/09/2015  . Medicare annual wellness visit, initial 02/11/2016   12/17,5/18, 6/19  12/17,5/18  . SCC (squamous cell carcinoma), face 06/19/2016    Patient Active Problem List   Diagnosis Date Noted  . SVT (supraventricular tachycardia) ([place]) 05/04/2018  . SCC (squamous cell carcinoma), face 06/19/2016  . Medicare annual wellness visit, initial 02/11/2016  . Acquired hypothyroidism 08/09/2015  . Benign essential hypertension 08/09/2015  . Hyperlipidemia, mixed 08/09/2015  . Aortic stenosis, moderate 02/07/2015    Past Surgical History:  Procedure Laterality Date  . APPENDECTOMY    . AXILLARY LYMPH  NODE BIOPSY     needle biopsy   . CHOLECYSTECTOMY    . MOHS SURGERY  07/2016   Dr. Manley Mason  . TONSILLECTOMY    . WISDOM TOOTH EXTRACTION      Prior to Admission medications   Medication Sig Start Date End Date Taking? Authorizing Provider  albuterol (PROVENTIL HFA;VENTOLIN HFA) 108 (90 Base) MCG/ACT inhaler Inhale 2 puffs into the lungs every 6 (six) hours as needed for wheezing or shortness of breath.   Yes [provider]  apixaban (ELIQUIS) 5 MG TABS tablet Take 1 tablet (5 mg total) by mouth 2 (two) times daily for 30 days. 05/05/18 06/04/18 Yes Pyreddy, Reatha Harps, MD  ferrous gluconate (FERGON) 324 MG tablet Take 324 mg by mouth daily. 04/19/18 04/19/19 Yes [provider]  furosemide (LASIX) 20 MG tablet Take 20 mg by mouth daily. 05/10/18  Yes [provider]  levothyroxine (SYNTHROID, LEVOTHROID) 50 MCG tablet TAKE ONE TABLET BY MOUTH DAILY 07/14/17  Yes [provider]  metoprolol succinate (TOPROL XL) 25 MG 24 hr tablet Take 1 tablet (25 mg total) by mouth daily. 05/04/18 05/04/19 Yes Eula Listen, MD  Multiple Vitamins-Minerals (MULTIVITAMIN ADULT) TABS Take 1 tablet by mouth daily.   Yes [provider]  sulfamethoxazole-trimethoprim (BACTRIM DS,SEPTRA DS) 800-160 MG tablet Take 1 tablet by mouth 2 (two) times daily for 7 days. 05/04/18 05/11/18 Yes Eula Listen, MD  clobetasol cream (TEMOVATE) 0.05 % APPLY TOPICALLY 3 TIMES A WEEK 02/12/17   [provider]  Diaper Rash Products (A+D DIAPER RASH) CREA Apply topically daily.  [provider]  Vitamins A & D (VITAMIN A & D) ointment Apply 1 application topically daily.    [provider]    Allergies Ace inhibitors; Fentanyl; Morphine; and Alendronate  Family History  Problem Relation Age of Onset  . Hypertension Mother   . Prostate cancer Father   . Healthy Sister   . Aortic stenosis Brother     Social History Social History   Tobacco Use  .  Smoking status: Never Smoker  . Smokeless tobacco: Never Used  Substance Use Topics  . Alcohol use: Yes  . Drug use: Never    Review of Systems  Constitutional: No fever. Eyes: No redness. ENT: No sore throat. Cardiovascular: Denies chest pain. Respiratory: Denies shortness of breath. Gastrointestinal: No vomiting. Genitourinary: Negative for hematuria.  Musculoskeletal: Negative for back pain. Skin: Negative for rash. Neurological: Negative for headache.   ____________________________________________   PHYSICAL EXAM:  VITAL SIGNS: ED Triage Vitals  Enc Vitals Group     BP 05/10/18 1759 (!) 153/73     Pulse Rate 05/10/18 1757 68     Resp 05/10/18 1757 14     Temp 05/10/18 1757 97.9 F (36.6 C)     Temp Source 05/10/18 1757 Oral     SpO2 05/10/18 1757 94 %     Weight 05/10/18 1757 175 lb (79.4 kg)     Height 05/10/18 1757 5\' 6"  (1.676 m)     Head Circumference --      Peak Flow --      Pain Score 05/10/18 1757 0     Pain Loc --      Pain Edu? --      Excl. in [place]? --     Constitutional: Alert and oriented.  Very well appearing for age and in no acute distress. Eyes: Conjunctivae are normal.  Head: Atraumatic. Nose: No congestion/rhinnorhea. Mouth/Throat: Mucous membranes are moist.   Neck: Normal range of motion.  Cardiovascular: Normal rate, regular rhythm. Grossly normal heart sounds.  Good peripheral circulation. Respiratory: Normal respiratory effort.  No retractions. Lungs CTAB. Gastrointestinal: Soft and nontender. No distention.  Small amount of blood residue seen on DRE.  No melena. Genitourinary: No flank tenderness. Musculoskeletal: Extremities warm and well perfused.  Neurologic:  Normal speech and language. No gross focal neurologic deficits are appreciated.  Skin:  Skin is warm and dry. No rash noted. Psychiatric: Mood and affect are normal. Speech and behavior are normal.  ____________________________________________   LABS (all labs ordered  are listed, but only abnormal results are displayed)  Labs Reviewed  CBC WITH DIFFERENTIAL/PLATELET - Abnormal; Notable for the following components:      Result Value   RBC 3.15 (*)    Hemoglobin 10.0 (*)    HCT 30.8 (*)    Platelets 500 (*)    nRBC 0.7 (*)    All other components within normal limits  PROTIME-INR  BASIC METABOLIC PANEL  TROPONIN I  LACTIC ACID, PLASMA  LACTIC ACID, PLASMA  URINALYSIS, COMPLETE (UACMP) WITH MICROSCOPIC  TYPE AND SCREEN  TYPE AND SCREEN   ____________________________________________  EKG  ED ECG REPORT I, Arta Silence, the attending physician, personally viewed and interpreted this ECG.  Date: 05/10/2018 EKG Time: 1757 Rate: 69 Rhythm: normal sinus rhythm QRS Axis: normal Intervals: RBBB, LAFB ST/T Wave abnormalities: normal Narrative Interpretation: no evidence of acute ischemia  ____________________________________________  RADIOLOGY    ____________________________________________   PROCEDURES  Procedure(s) performed: No  Procedures  Critical Care performed: Yes  CRITICAL CARE Performed by: Arta Silence   Total critical care time: 20 minutes  Critical care time was exclusive of separately billable procedures and treating other patients.  Critical care was necessary to treat or prevent imminent or life-threatening deterioration.  Critical care was time spent personally by me on the following activities: development of treatment plan with patient and/or surrogate as well as nursing, discussions with consultants, evaluation of patient's response to treatment, examination of patient, obtaining history from patient or surrogate, ordering and performing treatments and interventions, ordering and review of laboratory studies, ordering and review of radiographic studies, pulse oximetry and re-evaluation of patient's condition. ____________________________________________   INITIAL IMPRESSION / ASSESSMENT AND  PLAN / ED COURSE  Pertinent labs & imaging results that were available during my care of the patient were reviewed by me and considered in my medical decision making (see chart for details).  85 year old female with a history of aortic valve replacement on Eliquis presents with 3 episodes of bloody bowel movements today with no other abnormal bleeding or bruising.  She has no hematemesis.  No significant prior GI bleed history.  I reviewed the past medical records in Epic; the patient was most recently admitted last week with SVT and UTI and discharged on 05/05/2018.  On exam the patient is very well-appearing for her age and her vital signs are normal except for hypertension.  Abdomen is soft and nontender.  There is no active rectal bleeding and no evidence of melena; the patient has some residue of relatively bright appearing blood.  Overall I suspect most likely lower GI etiology.  The patient is on the cardiac monitor.  We will obtain labs and I will consult GI and plan for admission.  We will obviously discontinue the Eliquis although there is no evidence of life-threatening bleeding that would require reversal at this time.  ----------------------------------------- 7:42 PM on 05/10/2018 -----------------------------------------  The patient has had no further bloody bowel movements so far in the ED.  Her vital signs are stable.  Hemoglobin is 10, down from 11.31-week ago.  I consulted Dr. Bonna Gains from gastroenterology who recommended Protonix twice daily and states that the GI service will evaluate the patient.  I discussed the results of the work-up with the patient as well as with her son over the phone per her request.  They agree with the plan.  I signed the patient out to the hospitalist Dr. Benjie Karvonen.  ____________________________________________   FINAL CLINICAL IMPRESSION(S) / ED DIAGNOSES  Final diagnoses:  Acute GI bleeding      NEW MEDICATIONS STARTED DURING THIS  VISIT:  New Prescriptions   No medications on file     Note:  This document was prepared using Dragon voice recognition software and may include unintentional dictation errors.    Arta Silence, MD 05/10/18 [phone]

## 2018-05-10 NOTE — Progress Notes (Signed)
Patient lactic acid 3.0. MD notified. Orders received. Will continue to monitor.

## 2018-05-10 NOTE — ED Notes (Addendum)
ED TO INPATIENT HANDOFF REPORT  ED Nurse Name and Phone #:  Gershon Mussel RN   [phone]  S Name/Age/Gender Olivia [name] 85 y.o. female Room/Bed: ED18A/ED18A  Code Status   Code Status: DNR  Home/SNF/Other Home Patient oriented to: A&Ox4 Is this baseline? Yes   Triage Complete: Triage complete  Chief Complaint GI Bleed ems  Triage Note Pt to ED via EMS from PCP, lives at home, states had rectal bleeding x3 today.  Stopped taking Elequis today, some dizziness.  Presents A&Ox4, chest rise even and unlabored, SOB.  States stool dark and bright red with some clots.   Allergies Allergies  Allergen Reactions  . Ace Inhibitors     Other reaction(s): Cough  . Fentanyl Nausea And Vomiting  . Morphine Other (See Comments)    Other reaction(s): Muscle Pain  . Alendronate     Muscle pain    Level of Care/Admitting Diagnosis ED Disposition    ED Disposition Condition [facility] Area: [place] [[id number]]  Level of Care: Med-Surg [16]  Diagnosis: GIB (gastrointestinal bleeding) [696789]  Admitting Physician: Bettey Costa [[id number]]  Attending Physician: MODY, Ulice Bold [[id number]]  Estimated length of stay: past midnight tomorrow  Certification:: I certify this patient will need inpatient services for at least 2 midnights  PT Class (Do Not Modify): Inpatient [101]  PT Acc Code (Do Not Modify): Private [1]       B Medical/Surgery History Past Medical History:  Diagnosis Date  . Acquired hypothyroidism 08/09/2015  . Aortic stenosis, moderate 02/07/2015   Stable 5/17, slightly worse 5/18  . Benign essential hypertension 08/09/2015  . Chicken pox   . Colon polyps   . Hyperlipidemia, mixed 08/09/2015  . Medicare annual wellness visit, initial 02/11/2016   12/17,5/18, 6/19  12/17,5/18  . SCC (squamous cell carcinoma), face 06/19/2016   Past Surgical History:  Procedure Laterality Date  . APPENDECTOMY    . AXILLARY LYMPH NODE BIOPSY     needle biopsy   .  CHOLECYSTECTOMY    . MOHS SURGERY  07/2016   Dr. Manley Mason  . TONSILLECTOMY    . WISDOM TOOTH EXTRACTION       A IV Location/Drains/Wounds Patient Lines/Drains/Airways Status   Active Line/Drains/Airways    Name:   Placement date:   Placement time:   Site:   Days:   Peripheral IV 05/10/18 Right Hand   05/10/18    1818    Hand   less than 1          Intake/Output Last 24 hours No intake or output data in the 24 hours ending 05/10/18 2153  Labs/Imaging Results for orders placed or performed during the hospital encounter of 05/10/18 (from the past 48 hour(s))  Basic metabolic panel     Status: Abnormal   Collection Time: 05/10/18  6:00 PM  Result Value Ref Range   Sodium 133 (L) 135 - 145 mmol/L   Potassium 4.4 3.5 - 5.1 mmol/L   Chloride 102 98 - 111 mmol/L   CO2 20 (L) 22 - 32 mmol/L   Glucose, Bld 135 (H) 70 - 99 mg/dL   BUN 34 (H) 8 - 23 mg/dL   Creatinine, Ser 1.59 (H) 0.44 - 1.00 mg/dL   Calcium 8.7 (L) 8.9 - 10.3 mg/dL   GFR calc non Af Amer 28 (L) >60 mL/min   GFR calc Af Amer 33 (L) >60 mL/min   Anion gap 11 5 - 15    Comment:  Performed at [facility], [address] [place], [place] [address]  CBC with Differential     Status: Abnormal   Collection Time: 05/10/18  6:00 PM  Result Value Ref Range   WBC 9.4 4.0 - 10.5 K/uL   RBC 3.15 (L) 3.87 - 5.11 MIL/uL   Hemoglobin 10.0 (L) 12.0 - 15.0 g/dL   HCT 30.8 (L) 36.0 - 46.0 %   MCV 97.8 80.0 - 100.0 fL   MCH 31.7 26.0 - 34.0 pg   MCHC 32.5 30.0 - 36.0 g/dL   RDW 13.9 11.5 - 15.5 %   Platelets 500 (H) 150 - 400 K/uL   nRBC 0.7 (H) 0.0 - 0.2 %   Neutrophils Relative % 65 %   Neutro Abs 6.1 1.7 - 7.7 K/uL   Lymphocytes Relative 24 %   Lymphs Abs 2.3 0.7 - 4.0 K/uL   Monocytes Relative 9 %   Monocytes Absolute 0.8 0.1 - 1.0 K/uL   Eosinophils Relative 1 %   Eosinophils Absolute 0.1 0.0 - 0.5 K/uL   Basophils Relative 0 %   Basophils Absolute 0.0 0.0 - 0.1 K/uL   Immature Granulocytes 1 %   Abs  Immature Granulocytes 0.06 0.00 - 0.07 K/uL    Comment: Performed at [facility], [address] [place], [place] [address]  Protime-INR     Status: None   Collection Time: 05/10/18  6:00 PM  Result Value Ref Range   Prothrombin Time 15.1 11.4 - 15.2 seconds   INR 1.2 0.8 - 1.2    Comment: (NOTE) INR goal varies based on device and disease states. Performed at [facility], [address] [place], [place] [address]   Type and screen [place]     Status: None (Preliminary result)   Collection Time: 05/10/18  6:00 PM  Result Value Ref Range   ABO/RH(D) PENDING    Antibody Screen PENDING    Sample Expiration      05/13/2018 Performed at [facility], [address] [place], [place] [address]   Troponin I - Once     Status: None   Collection Time: 05/10/18  6:00 PM  Result Value Ref Range   Troponin I <0.03 <0.03 ng/mL    Comment: Performed at [facility], [address], [place] [address]  Lactic acid, plasma     Status: Abnormal   Collection Time: 05/10/18  6:00 PM  Result Value Ref Range   Lactic Acid, Venous 2.3 (HH) 0.5 - 1.9 mmol/L    Comment: CRITICAL RESULT CALLED TO, READ BACK BY AND VERIFIED WITH TOM Lindberg Zenon @2000  05/10/18 AKT Performed at [facility], [address] [place], [place] [address]   Type and screen Ordered by PROVIDER DEFAULT     Status: None (Preliminary result)   Collection Time: 05/10/18  8:36 PM  Result Value Ref Range   ABO/RH(D) PENDING    Antibody Screen PENDING    Sample Expiration      05/13/2018 Performed at [facility], [address] [place], [place] [address]    No results found.  Pending Labs Unresulted Labs (From admission, onward)    Start     Ordered   05/11/18 6195  Basic metabolic panel  Tomorrow morning,   STAT     05/10/18 1944   05/11/18 0500  CBC  Tomorrow morning,   STAT     05/10/18 1944   05/10/18 1943  Vitamin B12  (  Anemia  Panel (PNL))  Once,   STAT     05/10/18 1944   05/10/18 1943  Folate  (Anemia Panel (PNL))  Once,   STAT     05/10/18 1944   05/10/18 1943  Iron and TIBC  (Anemia Panel (PNL))  Once,   STAT     05/10/18 1944   05/10/18 1943  Ferritin  (Anemia Panel (PNL))  Once,   STAT     05/10/18 1944   05/10/18 1943  Reticulocytes  (Anemia Panel (PNL))  Once,   STAT     05/10/18 1944   05/10/18 1942  Hemoglobin  Now then every 6 hours,   STAT     05/10/18 1944   05/10/18 1754  Lactic acid, plasma  Now then every 2 hours,   STAT     05/10/18 1754   05/10/18 1754  Urinalysis, Complete w Microscopic  ONCE - STAT,   STAT     05/10/18 1754          Vitals/Pain Today's Vitals   05/10/18 1759 05/10/18 1816 05/10/18 1930 05/10/18 2148  BP: (!) 153/73  (!) 150/101 136/61  Pulse:   70 67  Resp:   19 19  Temp:    97.8 F (36.6 C)  TempSrc:    Oral  SpO2:   97% 98%  Weight:      Height:      PainSc:  0-No pain  0-No pain    Isolation Precautions No active isolations  Medications Medications  pantoprazole (PROTONIX) injection 40 mg (has no administration in time range)  A+D DIAPER RASH CREA (has no administration in time range)  levothyroxine (SYNTHROID, LEVOTHROID) tablet 50 mcg (has no administration in time range)  multivitamin with minerals tablet 1 tablet (has no administration in time range)  ferrous gluconate (FERGON) tablet 324 mg (has no administration in time range)  vitamin A & D ointment 1 application (has no administration in time range)  metoprolol succinate (TOPROL-XL) 24 hr tablet 25 mg (has no administration in time range)  furosemide (LASIX) tablet 20 mg (has no administration in time range)  albuterol (PROVENTIL) (2.5 MG/3ML) 0.083% nebulizer solution 2.5 mg (has no administration in time range)  acetaminophen (TYLENOL) tablet 650 mg (has no administration in time range)    Or  acetaminophen (TYLENOL) suppository 650 mg (has no administration in time range)  0.9 %  sodium  chloride infusion ( Intravenous New Bag/Given 05/10/18 2047)  ondansetron (ZOFRAN) tablet 4 mg (has no administration in time range)    Or  ondansetron (ZOFRAN) injection 4 mg (has no administration in time range)  polyethylene glycol (MIRALAX / GLYCOLAX) packet 17 g (has no administration in time range)  bisacodyl (DULCOLAX) EC tablet 5 mg (has no administration in time range)  HYDROcodone-acetaminophen (NORCO/VICODIN) 5-325 MG per tablet 1 tablet (has no administration in time range)    Mobility walks with device Low fall risk   Focused Assessments Cardiac Assessment Handoff:    Lab Results  Component Value Date   TROPONINI <0.03 05/10/2018   No results found for: DDIMER Does the Patient currently have chest pain? Yes     R Recommendations: See Admitting Provider Note  Report given to: Neoma Laming RN  Additional Notes:

## 2018-05-10 NOTE — H&P (Addendum)
[facility] NAME: Olivia [name]    MR#:  [id number]  DATE OF BIRTH:  December 02, 1925  DATE OF ADMISSION:  05/10/2018  PRIMARY CARE PHYSICIAN: Rusty Aus, MD   REQUESTING/REFERRING PHYSICIAN:  r siadecki  CHIEF COMPLAINT:   gib HISTORY OF PRESENT ILLNESS:  Olivia [name]  is a 85 y.o. female with a known history of aortic stenosis status post TAVR, hypothyroidism and hypertension who presents to the emergency room due to 2 bleeding x3.  Patient is on Eliquis.  Patient reports dizziness.  She denies chest pain or shortness of breath.  GI MD has been contacted by ER physician.  She does not want colonoscopy given age. PAST MEDICAL HISTORY:   Past Medical History:  Diagnosis Date  . Acquired hypothyroidism 08/09/2015  . Aortic stenosis, moderate 02/07/2015   Stable 5/17, slightly worse 5/18  . Benign essential hypertension 08/09/2015  . Chicken pox   . Colon polyps   . Hyperlipidemia, mixed 08/09/2015  . Medicare annual wellness visit, initial 02/11/2016   12/17,5/18, 6/19  12/17,5/18  . SCC (squamous cell carcinoma), face 06/19/2016    PAST SURGICAL HISTORY:   Past Surgical History:  Procedure Laterality Date  . APPENDECTOMY    . AXILLARY LYMPH NODE BIOPSY     needle biopsy   . CHOLECYSTECTOMY    . MOHS SURGERY  07/2016   Dr. Manley Mason  . TONSILLECTOMY    . WISDOM TOOTH EXTRACTION      SOCIAL HISTORY:   Social History   Tobacco Use  . Smoking status: Never Smoker  . Smokeless tobacco: Never Used  Substance Use Topics  . Alcohol use: Yes    FAMILY HISTORY:   Family History  Problem Relation Age of Onset  . Hypertension Mother   . Prostate cancer Father   . Healthy Sister   . Aortic stenosis Brother     DRUG ALLERGIES:   Allergies  Allergen Reactions  . Ace Inhibitors     Other reaction(s): Cough  . Fentanyl Nausea And Vomiting  . Morphine Other (See Comments)    Other reaction(s): Muscle Pain  .  Alendronate     Muscle pain    REVIEW OF SYSTEMS:   Review of Systems  Constitutional: Positive for malaise/fatigue. Negative for chills and fever.  HENT: Negative.  Negative for ear discharge, ear pain, hearing loss, nosebleeds and sore throat.   Eyes: Negative.  Negative for blurred vision and pain.  Respiratory: Negative.  Negative for cough, hemoptysis, shortness of breath and wheezing.   Cardiovascular: Negative.  Negative for chest pain, palpitations and leg swelling.  Gastrointestinal: Positive for blood in stool. Negative for abdominal pain, diarrhea, nausea and vomiting.  Genitourinary: Negative.  Negative for dysuria.  Musculoskeletal: Negative.  Negative for back pain.  Skin: Negative.   Neurological: Positive for dizziness. Negative for tremors, speech change, focal weakness, seizures and headaches.  Endo/Heme/Allergies: Negative.  Does not bruise/bleed easily.  Psychiatric/Behavioral: Negative.  Negative for depression, hallucinations and suicidal ideas.    MEDICATIONS AT HOME:   Prior to Admission medications   Medication Sig Start Date End Date Taking? Authorizing Provider  albuterol (PROVENTIL HFA;VENTOLIN HFA) 108 (90 Base) MCG/ACT inhaler Inhale 2 puffs into the lungs every 6 (six) hours as needed for wheezing or shortness of breath.   Yes [provider]  apixaban (ELIQUIS) 5 MG TABS tablet Take 1 tablet (5 mg total) by mouth 2 (two) times daily for 30 days.  05/05/18 06/04/18 Yes Pyreddy, Reatha Harps, MD  ferrous gluconate (FERGON) 324 MG tablet Take 324 mg by mouth daily. 04/19/18 04/19/19 Yes [provider]  furosemide (LASIX) 20 MG tablet Take 20 mg by mouth daily. 05/10/18  Yes [provider]  levothyroxine (SYNTHROID, LEVOTHROID) 50 MCG tablet TAKE ONE TABLET BY MOUTH DAILY 07/14/17  Yes [provider]  metoprolol succinate (TOPROL XL) 25 MG 24 hr tablet Take 1 tablet (25 mg total) by mouth daily. 05/04/18 05/04/19 Yes Eula Listen,  MD  Multiple Vitamins-Minerals (MULTIVITAMIN ADULT) TABS Take 1 tablet by mouth daily.   Yes [provider]  sulfamethoxazole-trimethoprim (BACTRIM DS,SEPTRA DS) 800-160 MG tablet Take 1 tablet by mouth 2 (two) times daily for 7 days. 05/04/18 05/11/18 Yes Eula Listen, MD  clobetasol cream (TEMOVATE) 0.05 % APPLY TOPICALLY 3 TIMES A WEEK 02/12/17   [provider]  Diaper Rash Products (A+D DIAPER RASH) CREA Apply topically daily.     [provider]  Vitamins A & D (VITAMIN A & D) ointment Apply 1 application topically daily.    [provider]      VITAL SIGNS:  Blood pressure (!) 153/73, pulse 68, temperature 97.9 F (36.6 C), temperature source Oral, resp. rate 14, height 5\' 6"  (1.676 m), weight 79.4 kg, SpO2 94 %.  PHYSICAL EXAMINATION:   Physical Exam Constitutional:      General: She is not in acute distress. HENT:     Head: Normocephalic.  Eyes:     General: No scleral icterus. Neck:     Musculoskeletal: Normal range of motion and neck supple.     Vascular: No JVD.     Trachea: No tracheal deviation.  Cardiovascular:     Rate and Rhythm: Normal rate and regular rhythm.     Heart sounds: Murmur present. No friction rub. No gallop.   Pulmonary:     Effort: Pulmonary effort is normal. No respiratory distress.     Breath sounds: Normal breath sounds. No wheezing or rales.  Chest:     Chest wall: No tenderness.  Abdominal:     General: Bowel sounds are normal. There is no distension.     Palpations: Abdomen is soft. There is no mass.     Tenderness: There is no abdominal tenderness. There is no guarding or rebound.  Musculoskeletal: Normal range of motion.  Skin:    General: Skin is warm.     Findings: No erythema or rash.  Neurological:     Mental Status: She is alert and oriented to person, place, and time.  Psychiatric:        Judgment: Judgment normal.       LABORATORY PANEL:   CBC Recent Labs  Lab 05/10/18 1800   WBC 9.4  HGB 10.0*  HCT 30.8*  PLT 500*   ------------------------------------------------------------------------------------------------------------------  Chemistries  Recent Labs  Lab 05/04/18 1203  NA 139  K 4.0  CL 106  CO2 24  GLUCOSE 99  BUN 22  CREATININE 1.08*  CALCIUM 9.2  MG 2.2   ------------------------------------------------------------------------------------------------------------------  Cardiac Enzymes Recent Labs  Lab 05/05/18 0241  TROPONINI 0.10*   ------------------------------------------------------------------------------------------------------------------  RADIOLOGY:  No results found.  EKG:   Orders placed or performed during the hospital encounter of 05/10/18  . ED EKG  . ED EKG  . EKG 12-Lead  . EKG 12-Lead    IMPRESSION AND PLAN:   85 year old female with history of aortic stenosis status post TAVR,  hyperlipidemia recently hospitalized for SVT who  presents with rectal bleeding x3.  1  Rectal bleeding:  As per GI continue PPI IV twice daily Serial hemoglobin GI bleeding scan ordered Transfuse if hemoglobin less than 7 Hold Eliquis Further recommendations after GI evaluation  2.  Hypothyroidism: Continue Synthroid  3.  Hyperlipidemia: Continue statin   All the records are reviewed and case discussed with ED provider. Management plans discussed with the patient and she is in agreement  CODE STATUS: FULL  TOTAL TIME TAKING CARE OF THIS PATIENT: 43 minutes.    Darry Kelnhofer M.D on 05/10/2018 at 7:45 PM  Between 7am to 6pm - Pager - [phone]  After 6pm go to [email or web address] - password [email or web address] [email or web address]  Office  [phone]  CC: Primary care physician; Rusty Aus, MD

## 2018-05-10 NOTE — Progress Notes (Signed)
Family Meeting Note  Advance Directive:yes  Today a meeting took place with the Patient.  The following clinical team members were present during this meeting:MD  The following were discussed:Patient's diagnosis rectal bleed: , Patient's progosis: Unable to determine and Goals for treatment: Full Code  Additional follow-up to be provided: outpatient PC services  Time spent during discussion:16 minutes  Romir Klimowicz, Ulice Bold, MD

## 2018-05-10 NOTE — ED Notes (Addendum)
Pt states she has had three grossly blood bowel movements today during and after a visit to her primary care provider. Pt denies any pain or previous Hx of similar Sx. Pt has been on eliquis for approx 1 week. Pt states her PCM told her to stop taking it today.

## 2018-05-10 NOTE — ED Triage Notes (Signed)
Pt to ED via EMS from PCP, lives at home, states had rectal bleeding x3 today.  Stopped taking Elequis today, some dizziness.  Presents A&Ox4, chest rise even and unlabored, SOB.  States stool dark and bright red with some clots.

## 2018-05-11 ENCOUNTER — Encounter: Payer: Self-pay | Admitting: Internal Medicine

## 2018-05-11 ENCOUNTER — Inpatient Hospital Stay: Payer: Medicare Other

## 2018-05-11 DIAGNOSIS — K921 Melena: Secondary | ICD-10-CM

## 2018-05-11 LAB — CBC
HCT: 22.6 % — ABNORMAL LOW (ref 36.0–46.0)
HEMOGLOBIN: 7.3 g/dL — AB (ref 12.0–15.0)
MCH: 31.9 pg (ref 26.0–34.0)
MCHC: 32.3 g/dL (ref 30.0–36.0)
MCV: 98.7 fL (ref 80.0–100.0)
Platelets: 392 10*3/uL (ref 150–400)
RBC: 2.29 MIL/uL — ABNORMAL LOW (ref 3.87–5.11)
RDW: 13.9 % (ref 11.5–15.5)
WBC: 9.3 10*3/uL (ref 4.0–10.5)
nRBC: 0.4 % — ABNORMAL HIGH (ref 0.0–0.2)

## 2018-05-11 LAB — GLUCOSE, CAPILLARY
Glucose-Capillary: 115 mg/dL — ABNORMAL HIGH (ref 70–99)
Glucose-Capillary: 107 mg/dL — ABNORMAL HIGH (ref 70–99)

## 2018-05-11 LAB — VITAMIN B12: Vitamin B-12: 521 pg/mL (ref 180–914)

## 2018-05-11 LAB — IRON AND TIBC
IRON: 47 ug/dL (ref 28–170)
Saturation Ratios: 18 % (ref 10.4–31.8)
TIBC: 261 ug/dL (ref 250–450)
UIBC: 214 ug/dL

## 2018-05-11 LAB — BASIC METABOLIC PANEL
Anion gap: 9 (ref 5–15)
BUN: 33 mg/dL — ABNORMAL HIGH (ref 8–23)
CO2: 20 mmol/L — ABNORMAL LOW (ref 22–32)
Calcium: 8.1 mg/dL — ABNORMAL LOW (ref 8.9–10.3)
Chloride: 106 mmol/L (ref 98–111)
Creatinine, Ser: 1.47 mg/dL — ABNORMAL HIGH (ref 0.44–1.00)
GFR calc Af Amer: 36 mL/min — ABNORMAL LOW (ref >60)
GFR calc non Af Amer: 31 mL/min — ABNORMAL LOW (ref >60)
GLUCOSE: 139 mg/dL — AB (ref 70–99)
Potassium: 4.5 mmol/L (ref 3.5–5.1)
Sodium: 135 mmol/L (ref 135–145)

## 2018-05-11 LAB — FERRITIN: Ferritin: 212 ng/mL (ref 11–307)

## 2018-05-11 LAB — HEMOGLOBIN
Hemoglobin: 7.5 g/dL — ABNORMAL LOW (ref 12.0–15.0)
Hemoglobin: 8.4 g/dL — ABNORMAL LOW (ref 12.0–15.0)
Hemoglobin: 8.4 g/dL — ABNORMAL LOW (ref 12.0–15.0)
Hemoglobin: 7.9 g/dL — ABNORMAL LOW (ref 12.0–15.0)

## 2018-05-11 LAB — ABO/RH: ABO/RH(D): A POS

## 2018-05-11 LAB — LACTIC ACID, PLASMA: Lactic Acid, Venous: 1.9 mmol/L (ref 0.5–1.9)

## 2018-05-11 LAB — PREPARE RBC (CROSSMATCH)

## 2018-05-11 LAB — FOLATE: FOLATE: 37 ng/mL (ref >5.9)

## 2018-05-11 MED ORDER — SODIUM CHLORIDE 0.9% IV SOLUTION: Freq: Once | INTRAVENOUS | Status: DC

## 2018-05-11 MED ORDER — PEG 3350-KCL-NA BICARB-NACL 420 G PO SOLR
4000.0000 mL | Freq: Once | ORAL | Status: AC
  Administered 2018-05-11: 4000 mL via ORAL
  Filled 2018-05-11: qty 4000

## 2018-05-11 MED ORDER — BISACODYL 5 MG PO TBEC
10.0000 mg | DELAYED_RELEASE_TABLET | Freq: Once | ORAL | Status: AC
  Administered 2018-05-11: 10 mg via ORAL
  Filled 2018-05-11: qty 2

## 2018-05-11 MED ORDER — TECHNETIUM TC 99M-LABELED RED BLOOD CELLS IV KIT
20.0000 | PACK | Freq: Once | INTRAVENOUS | Status: AC | PRN
  Administered 2018-05-11: 22.073 via INTRAVENOUS

## 2018-05-11 NOTE — Consult Note (Signed)
Reason for Consult: Preoperative clearance lower GI bleed paroxysmal atrial fibrillation Referring Physician: Dr. Benjie Karvonen Cardiologist Dr. Nehemiah Massed Hospitalist Dr. Emily Filbert primary  Olivia [name] is an 85 y.o. female.  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  Denies any chest pain or shortness of breath just bright red blood and bleeding rectally.  She denies any recent vomiting palpitations tachycardia or chest pain.  Patient is not sure if she is acceptable to colonoscopy which she will undergo the prep nonetheless states she lost the hospital 25 years ago at [facility] laboratory  Past Medical History:  Diagnosis Date  . Acquired hypothyroidism 08/09/2015  . Aortic stenosis, moderate 02/07/2015   Stable 5/17, slightly worse 5/18  . Atrial fibrillation ([place])   . Benign essential hypertension 08/09/2015  . Chicken pox   . Colon polyps   . Hyperlipidemia, mixed 08/09/2015  . Medicare annual wellness visit, initial 02/11/2016   12/17,5/18, 6/19  12/17,5/18  . SCC (squamous cell carcinoma), face 06/19/2016    Past Surgical History:  Procedure Laterality Date  . APPENDECTOMY    . AXILLARY LYMPH NODE BIOPSY     needle biopsy   . CHOLECYSTECTOMY    . MOHS SURGERY  07/2016   Dr. Manley Mason  . TONSILLECTOMY    . WISDOM TOOTH EXTRACTION      Family History  Problem Relation Age of Onset  . Hypertension Mother   . Prostate cancer Father   . Healthy Sister   . Aortic stenosis Brother     Social History:  reports that she has never smoked. She has never used smokeless tobacco. She  reports current alcohol use. She reports that she does not use drugs.  Allergies:  Allergies  Allergen Reactions  . Ace Inhibitors     Other reaction(s): Cough  . Fentanyl Nausea And Vomiting  . Morphine Other (See Comments)    Other reaction(s): Muscle Pain  . Alendronate     Muscle pain    Medications: I have reviewed the patient's current medications.  Results for orders placed or performed during the hospital encounter of 05/10/18 (from the past 48 hour(s))  Basic metabolic panel     Status: Abnormal   Collection Time: 05/10/18  6:00 PM  Result Value Ref Range   Sodium 133 (L) 135 - 145 mmol/L   Potassium 4.4 3.5 - 5.1 mmol/L   Chloride 102 98 - 111 mmol/L   CO2 20 (L) 22 - 32 mmol/L   Glucose, Bld 135 (H) 70 - 99 mg/dL   BUN 34 (H) 8 - 23 mg/dL   Creatinine, Ser 1.59 (H) 0.44 - 1.00 mg/dL   Calcium 8.7 (L) 8.9 - 10.3 mg/dL   GFR calc non Af Amer 28 (L) >60 mL/min   GFR calc Af Amer 33 (L) >60 mL/min   Anion gap 11 5 - 15    Comment: Performed at [facility], [address] [place], [place] [address]  CBC with Differential     Status: Abnormal   Collection Time: 05/10/18  6:00 PM  Result Value Ref Range   WBC 9.4 4.0 -  10.5 K/uL   RBC 3.15 (L) 3.87 - 5.11 MIL/uL   Hemoglobin 10.0 (L) 12.0 - 15.0 g/dL   HCT 30.8 (L) 36.0 - 46.0 %   MCV 97.8 80.0 - 100.0 fL   MCH 31.7 26.0 - 34.0 pg   MCHC 32.5 30.0 - 36.0 g/dL   RDW 13.9 11.5 - 15.5 %   Platelets 500 (H) 150 - 400 K/uL   nRBC 0.7 (H) 0.0 - 0.2 %   Neutrophils Relative % 65 %   Neutro Abs 6.1 1.7 - 7.7 K/uL   Lymphocytes Relative 24 %   Lymphs Abs 2.3 0.7 - 4.0 K/uL   Monocytes Relative 9 %   Monocytes Absolute 0.8 0.1 - 1.0 K/uL   Eosinophils Relative 1 %   Eosinophils Absolute 0.1 0.0 - 0.5 K/uL   Basophils Relative 0 %   Basophils Absolute 0.0 0.0 - 0.1 K/uL   Immature Granulocytes 1 %   Abs Immature Granulocytes 0.06 0.00 - 0.07 K/uL    Comment: Performed at [facility], [address] [place], [place] [address]  Protime-INR     Status: None   Collection Time: 05/10/18  6:00 PM  Result Value Ref Range   Prothrombin Time 15.1 11.4 - 15.2 seconds   INR 1.2 0.8 - 1.2    Comment: (NOTE) INR goal varies based on device and disease states. Performed at [facility], [address] [place], [place] [address]   Troponin I - Once     Status: None   Collection Time: 05/10/18  6:00 PM  Result Value Ref Range   Troponin I <0.03 <0.03 ng/mL    Comment: Performed at [facility], [address] [place], [place] [address]  Lactic acid, plasma     Status: Abnormal   Collection Time: 05/10/18  6:00 PM  Result Value Ref Range   Lactic Acid, Venous 2.3 (HH) 0.5 - 1.9 mmol/L    Comment: CRITICAL RESULT CALLED TO, READ BACK BY AND VERIFIED WITH TOM NAGY @2000  05/10/18 AKT Performed at [facility], [address] [place], [place] [address]   Type and screen Ordered by PROVIDER DEFAULT     Status: None (Preliminary result)   Collection Time: 05/10/18  8:36 PM  Result Value Ref Range   ABO/RH(D) A POS    Antibody Screen NEG    Sample Expiration 05/13/2018    Unit Number [id number]    Blood Component Type RED CELLS,LR    Unit division 00    Status of Unit ISSUED    Transfusion Status OK TO TRANSFUSE    Crossmatch Result      Compatible Performed at [facility], [place], [place] [address]   Lactic acid, plasma     Status: Abnormal   Collection Time: 05/10/18 10:53 PM  Result Value Ref Range   Lactic Acid, Venous 3.0 (HH) 0.5 - 1.9 mmol/L    Comment: CRITICAL RESULT CALLED TO, READ BACK BY AND VERIFIED WITH DEBRA GLADDIN AT 2331 05/10/2018 SMA Performed at [facility], [address] [place], [place] [address]   Hemoglobin     Status: Abnormal   Collection Time: 05/10/18 10:54 PM  Result Value Ref Range   Hemoglobin 9.0 (L) 12.0 - 15.0 g/dL    Comment: Performed at [facility], [address], [place], [place] [address]  Vitamin B12     Status: None   Collection Time: 05/10/18 10:54 PM  Result  Value Ref Range   Vitamin B-12 521 180 - 914 pg/mL    Comment: (NOTE) This assay is not validated for testing neonatal or myeloproliferative syndrome specimens for Vitamin B12 levels. Performed at [facility], [place], [place], [place] [address]   Folate     Status: None   Collection Time: 05/10/18 10:54 PM  Result Value Ref Range   Folate 37.0 >5.9 ng/mL    Comment: Performed at [facility], [address] [place], [place] [address]  Iron and TIBC     Status: None   Collection Time: 05/10/18 10:54 PM  Result Value Ref Range   Iron 47 28 - 170 ug/dL   TIBC 261 250 - 450 ug/dL   Saturation Ratios 18 10.4 - 31.8 %   UIBC 214 ug/dL    Comment: Performed at [facility], [address] [place], [place] [address]  Ferritin     Status: None   Collection Time: 05/10/18 10:54 PM  Result Value Ref Range   Ferritin 212 11 - 307 ng/mL    Comment: Performed at [facility], [address] [place], [place] [address]  Reticulocytes     Status: Abnormal   Collection Time: 05/10/18 10:54 PM  Result Value Ref Range   Retic Ct Pct 2.1 0.4 - 3.1 %   RBC. 2.80 (L) 3.87 - 5.11 MIL/uL   Retic Count, Absolute 59.6 19.0 - 186.0 K/uL   Immature Retic Fract 13.8 2.3 - 15.9 %    Comment: Performed at [facility], [address] [place], [place] [address]  Hemoglobin     Status: Abnormal   Collection Time: 05/11/18  2:30 AM  Result Value Ref Range   Hemoglobin 7.5 (L) 12.0 - 15.0 g/dL    Comment: Performed at [facility], [address] [place], [place] [address]  Lactic acid, plasma     Status: None   Collection Time: 05/11/18  2:30 AM  Result Value Ref Range   Lactic Acid, Venous 1.9 0.5 - 1.9 mmol/L    Comment: Performed at [facility], [address] [place], [place] [address]  Basic metabolic panel      Status: Abnormal   Collection Time: 05/11/18  3:48 AM  Result Value Ref Range   Sodium 135 135 - 145 mmol/L   Potassium 4.5 3.5 - 5.1 mmol/L   Chloride 106 98 - 111 mmol/L   CO2 20 (L) 22 - 32 mmol/L   Glucose, Bld 139 (H) 70 - 99 mg/dL   BUN 33 (H) 8 - 23 mg/dL   Creatinine, Ser 1.47 (H) 0.44 - 1.00 mg/dL   Calcium 8.1 (L) 8.9 - 10.3 mg/dL   GFR calc non Af Amer 31 (L) >60 mL/min   GFR calc Af Amer 36 (L) >60 mL/min   Anion gap 9 5 - 15    Comment: Performed at [facility], [address] [place],  [address]  CBC     Status: Abnormal   Collection Time: 05/11/18  3:48 AM  Result Value Ref Range   WBC 9.3 4.0 - 10.5 K/uL   RBC 2.29 (L) 3.87 - 5.11 MIL/uL   Hemoglobin 7.3 (L) 12.0 - 15.0 g/dL   HCT 22.6 (L) 36.0 - 46.0 %   MCV 98.7 80.0 - 100.0 fL   MCH 31.9 26.0 - 34.0 pg   MCHC 32.3 30.0 - 36.0 g/dL   RDW 13.9 11.5 - 15.5 %  Platelets 392 150 - 400 K/uL   nRBC 0.4 (H) 0.0 - 0.2 %    Comment: Performed at [facility], [address] [place], [place] [address]  ABO/Rh     Status: None   Collection Time: 05/11/18  3:48 AM  Result Value Ref Range   ABO/RH(D)      A POS Performed at [facility], [address] [place], [place] [address]   Prepare RBC     Status: None   Collection Time: 05/11/18  4:47 AM  Result Value Ref Range   Order Confirmation      ORDER PROCESSED BY BLOOD BANK Performed at [facility], [address] [place], [place] [address]   Glucose, capillary     Status: Abnormal   Collection Time: 05/11/18  7:56 AM  Result Value Ref Range   Glucose-Capillary 115 (H) 70 - 99 mg/dL   Comment 1 Notify RN   Hemoglobin     Status: Abnormal   Collection Time: 05/11/18 11:17 AM  Result Value Ref Range   Hemoglobin 8.4 (L) 12.0 - 15.0 g/dL    Comment: Performed at [facility], [address] [place], [place] [address]  Hemoglobin     Status: Abnormal   Collection Time: 05/11/18  3:34 PM   Result Value Ref Range   Hemoglobin 8.4 (L) 12.0 - 15.0 g/dL    Comment: Performed at [facility], [address] [place], [place] [address]  Hemoglobin     Status: Abnormal   Collection Time: 05/11/18  7:33 PM  Result Value Ref Range   Hemoglobin 7.9 (L) 12.0 - 15.0 g/dL    Comment: Performed at [facility], [address] [place],  [address]  Glucose, capillary     Status: Abnormal   Collection Time: 05/11/18  9:17 PM  Result Value Ref Range   Glucose-Capillary 107 (H) 70 - 99 mg/dL   Comment 1 Notify RN     Nm Gi Blood Loss  Result Date: 05/11/2018 CLINICAL DATA:  GI bleed, episodes of red blood coming from rectum beginning 24 hours ago, decreased now, heart valve repair placed on Eliquis last week for possible clot EXAM: NUCLEAR MEDICINE GASTROINTESTINAL BLEEDING SCAN TECHNIQUE: Sequential abdominal images were obtained following intravenous administration of Tc-21m labeled red blood cells. RADIOPHARMACEUTICALS:  22.07 mCi Tc-9m pertechnetate in-vitro labeled red cells. COMPARISON:  None Correlation: None FINDINGS: Scattered patient motion throughout the 2 hours of imaging. Normal initial blood pool distribution of tracer. During the first hour imaging, no abnormal gastrointestinal localization of tracer is identified. Small amount of de labeled tracer is seen excreted into the urinary bladder. Second hour of imaging demonstrates initially normal blood pool distribution of tracer. During the second hour of imaging, abnormal gastrointestinal localization of tracer is seen at the upper mid abdomen, curvilinear, based on position question gastric versus distal transverse colonic source. IMPRESSION: Abnormal GI bleeding exam demonstrating delayed GI localization of tracer during the second hour of imaging, in a curvilinear fashion located at the upper mid abdomen and LEFT upper quadrant, question gastric versus transverse colonic source. Electronically Signed   By:  Lavonia Dana M.D.   On: 05/11/2018 15:29    Review of Systems  Constitutional: Positive for diaphoresis and malaise/fatigue.  HENT: Positive for congestion.   Eyes: Negative.   Respiratory: Negative.   Cardiovascular: Negative.   Gastrointestinal: Positive for blood in stool and melena.  Genitourinary: Negative.   Musculoskeletal: Negative.   Skin:  Negative.   Neurological: Negative.   Endo/Heme/Allergies: Negative.   Psychiatric/Behavioral: Negative.    Blood pressure (!) 144/54, pulse 84, temperature 98.1 F (36.7 C), temperature source Oral, resp. rate 17, height 5\' 6"  (1.676 m), weight 74.9 kg, SpO2 97 %. Physical Exam  Nursing note and vitals reviewed. Constitutional: She is oriented to person, place, and time. She appears well-developed and well-nourished.  HENT:  Head: Normocephalic and atraumatic.  Eyes: Pupils are equal, round, and reactive to light. Conjunctivae and EOM are normal.  Neck: Normal range of motion. Neck supple.  Cardiovascular: Normal rate and regular rhythm.  Murmur heard. Respiratory: Effort normal and breath sounds normal.  GI: Soft. Bowel sounds are normal.  Musculoskeletal: Normal range of motion.  Neurological: She is alert and oriented to person, place, and time. She has normal reflexes.  Skin: Skin is warm and dry.  Psychiatric: She has a normal mood and affect.    Assessment/Plan: Preop clearance for colonoscopy Lower GI bleeding Anemia Paroxysmal atrial fibrillation Bioprosthetic aortic valve secondary to TAVR Mild obesity Mixed hyperlipidemia Squamous cell lung cancer Hyperlipidemia Mild obesity . Plan Agree with admit for further work-up Continue to hold Eliquis Proceed with bowel prep for colonoscopy Transfuse to keep hemoglobin above 8 Continue medical therapy for now Appears to be an acceptable risk for colonoscopy mild to moderate I do not recommend any further invasive cardiac evaluation We will strongly consider  discontinuing Eliquis indefinitely or at least reduce the dose to 2.5 twice a day  Liat Mayol D Deylan Canterbury 05/11/2018, 11:10 PM

## 2018-05-11 NOTE — Consult Note (Addendum)
Vonda Antigua, MD [address], [place], [place], [place], [address] [address], [place], [place], [place], [address] Phone: [phone]  Fax: [phone]  Consultation  Referring Provider:     Dr. Benjie Karvonen Primary Care Physician:  Rusty Aus, MD Reason for Consultation:    Hematochezia  Date of Admission:  05/10/2018 Date of Consultation:  05/11/2018         HPI:   Olivia [name] is a 85 y.o. female with 1 day history of bright red per rectum, that started yesterday night.  Patient states she has now had 7 episodes, the last episode being about 2 hours ago.  However, episodes of decrease in quantity since they started.  No abdominal pain.  No nausea or vomiting.  No melena.  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  Prior to that she was only on aspirin daily.  Patient states she had an episode like this about 30 years ago but it resolved after a weekend and she did not seek medical attention.  Patient states her last colonoscopy was when she was 73.  She states she has had several colonoscopies in the past and has never had polyps.  No family history of colon cancer.  Past Medical History:  Diagnosis Date  . Acquired hypothyroidism 08/09/2015  . Aortic stenosis, moderate 02/07/2015   Stable 5/17, slightly worse 5/18  . Atrial fibrillation ([place])   . Benign essential hypertension 08/09/2015  . Chicken pox   . Colon polyps   . Hyperlipidemia, mixed 08/09/2015  . Medicare annual wellness visit, initial 02/11/2016   12/17,5/18, 6/19  12/17,5/18  . SCC (squamous cell carcinoma), face 06/19/2016    Past Surgical History:  Procedure Laterality Date  . APPENDECTOMY    . AXILLARY LYMPH NODE BIOPSY     needle biopsy   . CHOLECYSTECTOMY    . MOHS SURGERY  07/2016   Dr. Manley Mason  . TONSILLECTOMY    . WISDOM TOOTH EXTRACTION      Prior to Admission medications   Medication Sig Start Date End Date Taking? Authorizing Provider    albuterol (PROVENTIL HFA;VENTOLIN HFA) 108 (90 Base) MCG/ACT inhaler Inhale 2 puffs into the lungs every 6 (six) hours as needed for wheezing or shortness of breath.   Yes [provider]  apixaban (ELIQUIS) 5 MG TABS tablet Take 1 tablet (5 mg total) by mouth 2 (two) times daily for 30 days. 05/05/18 06/04/18 Yes Pyreddy, Reatha Harps, MD  ferrous gluconate (FERGON) 324 MG tablet Take 324 mg by mouth daily. 04/19/18 04/19/19 Yes [provider]  furosemide (LASIX) 20 MG tablet Take 20 mg by mouth daily. 05/10/18  Yes [provider]  levothyroxine (SYNTHROID, LEVOTHROID) 50 MCG tablet TAKE ONE TABLET BY MOUTH DAILY 07/14/17  Yes [provider]  metoprolol succinate (TOPROL XL) 25 MG 24 hr tablet Take 1 tablet (25 mg total) by mouth daily. 05/04/18 05/04/19 Yes Eula Listen, MD  Multiple Vitamins-Minerals (MULTIVITAMIN ADULT) TABS Take 1 tablet by mouth daily.   Yes [provider]  sulfamethoxazole-trimethoprim (BACTRIM DS,SEPTRA DS) 800-160 MG tablet Take 1 tablet by mouth 2 (two) times daily for 7 days. 05/04/18 05/11/18 Yes Eula Listen, MD  clobetasol cream (TEMOVATE) 0.05 % APPLY TOPICALLY 3 TIMES A WEEK 02/12/17   [provider]  Diaper Rash Products (A+D DIAPER RASH) CREA Apply topically daily.     [provider]  Vitamins A &  D (VITAMIN A & D) ointment Apply 1 application topically daily.    [provider]    Family History  Problem Relation Age of Onset  . Hypertension Mother   . Prostate cancer Father   . Healthy Sister   . Aortic stenosis Brother      Social History   Tobacco Use  . Smoking status: Never Smoker  . Smokeless tobacco: Never Used  Substance Use Topics  . Alcohol use: Yes  . Drug use: Never    Allergies as of 05/10/2018 - Review Complete 05/10/2018  Allergen Reaction Noted  . Ace inhibitors  07/17/2013  . Fentanyl Nausea And Vomiting 11/03/2017  . Morphine Other (See Comments)  07/18/2013  . Alendronate  07/18/2013    Review of Systems:    All systems reviewed and negative except where noted in HPI.   Physical Exam:  Vital signs in last 24 hours: Vitals:   05/11/18 0534 05/11/18 0554 05/11/18 0749 05/11/18 0913  BP: (!) 145/47 (!) 107/53 (!) 136/95 131/72  Pulse: 82 80 76 83  Resp: 20 20 18 18   Temp: 98 F (36.7 C) (!) 97.5 F (36.4 C) 98.2 F (36.8 C) 97.9 F (36.6 C)  TempSrc: Oral Oral Oral Oral  SpO2: 95% 99% 96% 95%  Weight:      Height:       Last BM Date: 05/11/18 General:   Pleasant, cooperative in NAD Head:  Normocephalic and atraumatic. Eyes:   No icterus.   Conjunctiva pink. PERRLA. Ears:  Normal auditory acuity. Neck:  Supple; no masses or thyroidomegaly Lungs: Respirations even and unlabored. Lungs clear to auscultation bilaterally.   No wheezes, crackles, or rhonchi.  Abdomen:  Soft, nondistended, nontender. Normal bowel sounds. No appreciable masses or hepatomegaly.  No rebound or guarding.  Neurologic:  Alert and oriented x3;  grossly normal neurologically. Skin:  Intact without significant lesions or rashes. Cervical Nodes:  No significant cervical adenopathy. Psych:  Alert and cooperative. Normal affect.  LAB RESULTS: Recent Labs    05/10/18 1800 05/10/18 2254 05/11/18 0230 05/11/18 0348  WBC 9.4  --   --  9.3  HGB 10.0* 9.0* 7.5* 7.3*  HCT 30.8*  --   --  22.6*  PLT 500*  --   --  392   BMET Recent Labs    05/10/18 1800 05/11/18 0348  NA 133* 135  K 4.4 4.5  CL 102 106  CO2 20* 20*  GLUCOSE 135* 139*  BUN 34* 33*  CREATININE 1.59* 1.47*  CALCIUM 8.7* 8.1*   LFT No results for input(s): PROT, ALBUMIN, AST, ALT, ALKPHOS, BILITOT, BILIDIR, IBILI in the last 72 hours. PT/INR Recent Labs    05/10/18 1800  LABPROT 15.1  INR 1.2    STUDIES: No results found.    Impression / Plan:   Olivia [name] is a 85 y.o. y/o female with hematochezia in the setting of Eliquis use  Patient symptoms are most  consistent with likely diverticular bleeding Patient is very prehensile about doing a colonoscopy at her age Her son is a physician and she requested that we speak to him over the phone and so I did.  I discussed that given that she has continued to have hematochezia while in the hospital and her hemoglobin has dropped about 3 g, she needs further evaluation to help identify and stop any sources of active bleeding.  We discussed the locations of starting colonoscopy prep today as it can be both therapeutic and  diagnostic.  If her bleeding stops, patient does not want to proceed with colonoscopy, but if the bleeding continues she talked with her son and would be okay with doing the colonoscopy.  I see that she has a bleeding scan scheduled for today.  Please proceed with bleeding scan and if it is positive consult vascular surgery for embolization.  She is only 24 hours out after her last Eliquis dose today, and will be 48 hours out tomorrow.  Therefore, can potentially proceed with colonoscopy tomorrow if bleeding continues and patient is agreeable with the procedure.  I will await bleeding scan results prior to starting her colonoscopy prep.  Patient will need cardiology consult to obtain clearance for the procedure given her significant cardiac history and valve replacement.  Continue serial CBC and transfuse PRN Avoid NSAIDs  Patient is being seen for Dr. Marius Ditch today, and Dr. Marius Ditch will resume her care tomorrow.  Above discussed with Dr. Anselm Jungling who agrees with the plan and will consult cardiology.  Thank you for involving me in the care of this patient.    Over 50% of the time was spent coordinating care, discussing signs and symptoms and further plan of care with patient and family.    LOS: 1 day   Virgel Manifold, MD  05/11/2018, 11:09 AM

## 2018-05-11 NOTE — Clinical Social Work Note (Signed)
Clinical Social Work Assessment  Patient Details  Name: Olivia [name] MRN: [id number] Date of Birth: 1925/08/08  Date of referral:  05/11/18               Reason for consult:  Facility Placement                Permission sought to share information with:  Chartered certified accountant granted to share information::  Yes, Verbal Permission Granted  Name::      [facility]   Agency::     Relationship::     Contact Information:     Housing/Transportation Living arrangements for the past 2 months:  [place] of Information:  Patient, Facility Patient Interpreter Needed:  None Criminal Activity/Legal Involvement Pertinent to Current Situation/Hospitalization:  No - Comment as needed Significant Relationships:  Adult Children Lives with:  Self Do you feel safe going back to the place where you live?  Yes Need for family participation in patient care:  Yes (Comment)  Care giving concerns:  Patient lives at [facility] independent living alone. Per patient she has 2 adult sons that live out of town.   Social Worker assessment / plan:  Holiday representative ([place]) reviewed chart and noted that patient is from [place]. Per [facility] admissions coordinator at [facility] patient is an independent living resident and can come to [facility] for short term rehab. CSW met with patient alone at bedside and made her aware of above. Per patient she lives at [facility] independent living alone and would like to go to [facility] for rehab. PT is pending. CSW explained that patient's insurance [employer] will have to approve SNF. Patient also reported that she has 14 SNF respite days to use if [facility] does not approve SNF. FL2 complete and sent to [facility]. CSW will continue to follow and assist as needed.   Employment status:  Retired Nurse, adult PT Recommendations:  Not assessed at this time [place] / Referral to  community resources:  [place]  Patient/Family's Response to care:  Patient prefers to D/C to [place].   Patient/Family's Understanding of and Emotional Response to Diagnosis, Current Treatment, and Prognosis:  Patient was very pleasant and thanked CSW for assistance.   Emotional Assessment Appearance:  Appears stated age Attitude/Demeanor/Rapport:    Affect (typically observed):  Accepting, Adaptable, Pleasant Orientation:  Oriented to Self, Oriented to Place, Oriented to  Time, Oriented to Situation Alcohol / Substance use:  Not Applicable Psych involvement (Current and /or in the community):  No (Comment)  Discharge Needs  Concerns to be addressed:  Discharge Planning Concerns Readmission within the last 30 days:  No Current discharge risk:  Dependent with Mobility Barriers to Discharge:  Continued Medical Work up   [employer], Veronia Beets, LCSW 05/11/2018, 3:38 PM

## 2018-05-11 NOTE — Progress Notes (Signed)
Bleeding scan reports doing glide localization of tracer during the second hour of imaging, in the upper mid abdomen and left upper quadrant.  Report states question gastric versus transverse colonic source.  Therefore, the bleeding scan has not been able to localize the source.  This was discussed with primary team, Dr. Anselm Jungling and vascular surgery Dr. Lucky Cowboy as well.  Dr. dew has recommended to hold Eliquis and repeat bleeding scan in 1 to 2 days to see if it is still positive.  Patient's hemoglobin has stayed stable today after she received 1 unit blood transfusion and responded well to the unit.  We will plan on starting her colonoscopy prep today, and depending on whether she completes the prep today or tomorrow, we will plan on EGD and a colonoscopy to localize and potentially treat the underlying source.  If we are not able to treat an underlying source, we may be able to localize it and offer vascular surgery to embolize the area of bleeding.  Continue CBCs and transfuse. Continue Protonix 40 IV twice daily Avoid NSAIDs

## 2018-05-11 NOTE — Progress Notes (Signed)
Patient OOB to bedside commode. Experienced dizziness. Hgb. 7.3. MD notified orders received to infuse 1 unit of blood

## 2018-05-11 NOTE — Progress Notes (Signed)
Rapid Response Event Note  Overview: pt in room on bedside commode alert and oriented having a bloody BM.       Initial Focused Assessment: pt denies pain ,VS wnL. sats WNL on room air  Pt hgb dropped to 7.3 since admission   Interventions:pt will no longer get up to [facility] at this time.and will receive 1 unit of PRBC  Plan of Care (if not transferred):  Event Summary:   at  0428    at          [facility]

## 2018-05-11 NOTE — Progress Notes (Addendum)
[place] NAME: Olivia [name]    MR#:  [id number]  DATE OF BIRTH:  1925-08-30  SUBJECTIVE:  CHIEF COMPLAINT:   Chief Complaint  Patient presents with  . Rectal Bleeding   Admitted last week and started on Eliquis for A. fib.  Had few episodes of GI bleed and came to hospital.  Noted to have low hemoglobin and received 1 unit of transfusion today morning.  Was feeling little bit better today.  She had 1-2 episodes of dark-colored stool today but not frank red like yesterday.  REVIEW OF SYSTEMS:  CONSTITUTIONAL: No fever, have fatigue or weakness.  EYES: No blurred or double vision.  EARS, NOSE, AND THROAT: No tinnitus or ear pain.  RESPIRATORY: No cough, shortness of breath, wheezing or hemoptysis.  CARDIOVASCULAR: No chest pain, orthopnea, edema.  GASTROINTESTINAL: No nausea, vomiting, diarrhea or abdominal pain.  GENITOURINARY: No dysuria, hematuria.  ENDOCRINE: No polyuria, nocturia,  HEMATOLOGY: No anemia, easy bruising or bleeding SKIN: No rash or lesion. MUSCULOSKELETAL: No joint pain or arthritis.   NEUROLOGIC: No tingling, numbness, weakness.  PSYCHIATRY: No anxiety or depression.   ROS  DRUG ALLERGIES:   Allergies  Allergen Reactions  . Ace Inhibitors     Other reaction(s): Cough  . Fentanyl Nausea And Vomiting  . Morphine Other (See Comments)    Other reaction(s): Muscle Pain  . Alendronate     Muscle pain    VITALS:  Blood pressure (!) 144/54, pulse 84, temperature 98.1 F (36.7 C), temperature source Oral, resp. rate 17, height 5\' 6"  (1.676 m), weight 74.9 kg, SpO2 97 %.  PHYSICAL EXAMINATION:  GENERAL:  85 y.o.-year-old patient lying in the bed with no acute distress.  EYES: Pupils equal, round, reactive to light and accommodation. No scleral icterus. Extraocular muscles intact.  HEENT: Head atraumatic, normocephalic. Oropharynx and nasopharynx clear.  NECK:  Supple, no jugular venous distention. No  thyroid enlargement, no tenderness.  LUNGS: Normal breath sounds bilaterally, no wheezing, rales,rhonchi or crepitation. No use of accessory muscles of respiration.  CARDIOVASCULAR: S1, S2 normal.  Systolic murmurs, no rubs, or gallops.  ABDOMEN: Soft, nontender, nondistended. Bowel sounds present. No organomegaly or mass.  EXTREMITIES: No pedal edema, cyanosis, or clubbing.  NEUROLOGIC: Cranial nerves II through XII are intact. Muscle strength 4/5 in all extremities. Sensation intact. Gait not checked.  PSYCHIATRIC: The patient is alert and oriented x 3.  SKIN: No obvious rash, lesion, or ulcer.   Physical Exam LABORATORY PANEL:   CBC Recent Labs  Lab 05/11/18 0348 05/11/18 1117  WBC 9.3  --   HGB 7.3* 8.4*  HCT 22.6*  --   PLT 392  --    ------------------------------------------------------------------------------------------------------------------  Chemistries  Recent Labs  Lab 05/11/18 0348  NA 135  K 4.5  CL 106  CO2 20*  GLUCOSE 139*  BUN 33*  CREATININE 1.47*  CALCIUM 8.1*   ------------------------------------------------------------------------------------------------------------------  Cardiac Enzymes Recent Labs  Lab 05/05/18 0241 05/10/18 1800  TROPONINI 0.10* <0.03   ------------------------------------------------------------------------------------------------------------------  RADIOLOGY:  No results found.  ASSESSMENT AND PLAN:   Active Problems:   GIB (gastrointestinal bleeding)   85 year old female with history of aortic stenosis status post TAVR,  hyperlipidemia recently hospitalized for SVT who presents with rectal bleeding x3.  *  Rectal bleeding:   per GI continue PPI IV twice daily Serial hemoglobin GI bleeding scan done-no result available yet. Hold Eliquis GI is planning to do colonoscopy but it may  be after 2 to 3 days of Eliquis due to renal function.  *Acute anemia due to blood loss Transfuse 1 unit of PRBC today.   Continue to follow serial hemoglobin.  *Atrial fibrillation Currently rate is controlled, hold Eliquis and we may not be able to resume due to her GI bleed and old age. GI has requested to get cardiology clearance for the procedure of colonoscopy. I have contacted Dr. Towanda [name] regarding this.  *  Hypothyroidism: Continue Synthroid  *  Hyperlipidemia: Continue statin    All the records are reviewed and case discussed with Care Management/Social Workerr. Management plans discussed with the patient, family and they are in agreement.  CODE STATUS: Full  TOTAL TIME TAKING CARE OF THIS PATIENT: 35 minutes.     POSSIBLE D/C IN 1-2 DAYS, DEPENDING ON CLINICAL CONDITION.   Olivia [name] M.D on 05/11/2018   Between 7am to 6pm - Pager - [phone]  After 6pm go to [email or web address] - password [email or web address] [facility]  Office  [phone]  CC: Primary care physician; Olivia Aus, MD  Note: This dictation was prepared with Dragon dictation along with smaller phrase technology. Any transcriptional errors that result from this process are unintentional.

## 2018-05-11 NOTE — Clinical Social Work Placement (Signed)
   CLINICAL SOCIAL WORK PLACEMENT  NOTE  Date:  05/11/2018  Patient Details  Name: Olivia [name] MRN: [id number] Date of Birth: [birth year]  Clinical Social Work is seeking post-discharge placement for this patient at the [place] level of care (*CSW will initial, date and re-position this form in  chart as items are completed):  Yes   Patient/family provided with [place] Work Department's list of facilities offering this level of care within the geographic area requested by the patient (or if unable, by the patient's family).  Yes   Patient/family informed of their freedom to choose among providers that offer the needed level of care, that participate in Medicare, Medicaid or managed care program needed by the patient, have an available bed and are willing to accept the patient.  Yes   Patient/family informed of [facility]'s ownership interest in [facility] and [facility], as well as of the fact that they are under no obligation to receive care at these facilities.  PASRR submitted to EDS on 05/11/18     PASRR number received on 05/11/18     Existing PASRR number confirmed on       FL2 transmitted to all facilities in geographic area requested by pt/family on 05/11/18     FL2 transmitted to all facilities within larger geographic area on       Patient informed that his/her managed care company has contracts with or will negotiate with certain facilities, including the following:        Yes   Patient/family informed of bed offers received.  Patient chooses bed at [facility] )     Physician recommends and patient chooses bed at      Patient to be transferred to   on  .  Patient to be transferred to facility by       Patient family notified on   of transfer.  Name of family member notified:        PHYSICIAN       Additional Comment:    _______________________________________________ Christoffer Currier, Veronia Beets, LCSW 05/11/2018,  3:36 PM

## 2018-05-11 NOTE — Progress Notes (Signed)
Family Meeting Note  Advance Directive:yes  Today a meeting took place with the Patient and good friend.   The following clinical team members were present during this meeting:MD  The following were discussed:Patient's diagnosis: Gi bleed, A fib, Anemia, Patient's progosis: Unable to determine and Goals for treatment: Full Code  Patient was labeled as DNR by admitting doctor but I had discussion with her and she said her son is power of attorney but she would like to have full scope of treatment and then let him make the decision.  I have changed to full code.  Additional follow-up to be provided: cardio, GI  Time spent during discussion:20 minutes  [name], MD

## 2018-05-11 NOTE — NC FL2 (Signed)
[place] LEVEL OF CARE SCREENING TOOL     IDENTIFICATION  Patient Name: NASRA COUNCE Birthdate: 1925-06-04 Sex: female Admission Date (Current Location): 05/10/2018  [place] and [place] Number:  Engineering geologist and Address:  [facility], [address], [place], [place] [address]      Provider Number: [id number]  Attending Physician Name and Address:  [name], *  Relative Name and Phone Number:       Current Level of Care: Hospital Recommended Level of Care: [place] Prior Approval Number:    Date Approved/Denied:   PASRR Number: [id number])  Discharge Plan: SNF    Current Diagnoses: Patient Active Problem List   Diagnosis Date Noted  . GIB (gastrointestinal bleeding) 05/10/2018  . SVT (supraventricular tachycardia) ([place]) 05/04/2018  . SCC (squamous cell carcinoma), face 06/19/2016  . Medicare annual wellness visit, initial 02/11/2016  . Acquired hypothyroidism 08/09/2015  . Benign essential hypertension 08/09/2015  . Hyperlipidemia, mixed 08/09/2015  . Aortic stenosis, moderate 02/07/2015    Orientation RESPIRATION BLADDER Height & Weight     Self, Time, Place, Situation  Normal Continent Weight: 165 lb 3.2 oz (74.9 kg) Height:  5\' 6"  (167.6 cm)  BEHAVIORAL SYMPTOMS/MOOD NEUROLOGICAL BOWEL NUTRITION STATUS      Continent Diet(Diet: NPO to be advanced. )  AMBULATORY STATUS COMMUNICATION OF NEEDS Skin   Extensive Assist Verbally Normal                       Personal Care Assistance Level of Assistance  Bathing, Feeding, Dressing Bathing Assistance: Limited assistance Feeding assistance: Independent Dressing Assistance: Limited assistance     Functional Limitations Info  Sight, Hearing, Speech Sight Info: Adequate Hearing Info: Adequate Speech Info: Adequate    SPECIAL CARE FACTORS FREQUENCY  PT (By licensed PT), OT (By licensed OT)     PT Frequency: (5) OT Frequency:  (5)            Contractures      Additional Factors Info  Code Status, Allergies Code Status Info: (DNR ) Allergies Info: (Ace Inhibitors, Fentanyl, Morphine, Alendronate)           Current Medications (05/11/2018):  This is the current hospital active medication list Current Facility-Administered Medications  Medication Dose Route Frequency Provider Last Rate Last Dose  . 0.9 %  sodium chloride infusion (Manually program via Guardrails IV Fluids)   Intravenous Once Arta Silence, MD      . 0.9 %  sodium chloride infusion   Intravenous Continuous Bettey Costa, MD 50 mL/hr at 05/10/18 2047    . acetaminophen (TYLENOL) tablet 650 mg  650 mg Oral Q6H PRN Bettey Costa, MD       Or  . acetaminophen (TYLENOL) suppository 650 mg  650 mg Rectal Q6H PRN Mody, Sital, MD      . albuterol (PROVENTIL) (2.5 MG/3ML) 0.083% nebulizer solution 2.5 mg  2.5 mg Inhalation Q6H PRN Mody, Sital, MD      . bisacodyl (DULCOLAX) EC tablet 5 mg  5 mg Oral Daily PRN Mody, Sital, MD      . ferrous gluconate (FERGON) tablet 324 mg  324 mg Oral Daily Mody, Sital, MD      . HYDROcodone-acetaminophen (NORCO/VICODIN) 5-325 MG per tablet 1 tablet  1 tablet Oral Q4H PRN Mody, Sital, MD      . levothyroxine (SYNTHROID, LEVOTHROID) tablet 50 mcg  50 mcg Oral Daily Bettey Costa, MD      .  metoprolol succinate (TOPROL-XL) 24 hr tablet 25 mg  25 mg Oral Daily Mody, Sital, MD      . multivitamin with minerals tablet 1 tablet  1 tablet Oral Daily Mody, Sital, MD      . ondansetron (ZOFRAN) tablet 4 mg  4 mg Oral Q6H PRN Mody, Sital, MD       Or  . ondansetron (ZOFRAN) injection 4 mg  4 mg Intravenous Q6H PRN Mody, Sital, MD      . pantoprazole (PROTONIX) injection 40 mg  40 mg Intravenous Q12H Mody, Sital, MD   40 mg at 05/11/18 1100  . polyethylene glycol (MIRALAX / GLYCOLAX) packet 17 g  17 g Oral Daily PRN Bettey Costa, MD      . vitamin A & D ointment 1 application  1 application Topical Daily PRN Bettey Costa, MD          Discharge Medications: Please see discharge summary for a list of discharge medications.  Relevant Imaging Results:  Relevant Lab Results:   Additional Information (SSN: [id number])  Rabecka Brendel, Veronia Beets, LCSW

## 2018-05-12 ENCOUNTER — Encounter
Admission: EM | Disposition: A | Discharge status: Skilled Nursing Facility | Payer: Self-pay | Source: Home / Self Care | Attending: Internal Medicine

## 2018-05-12 ENCOUNTER — Inpatient Hospital Stay: Payer: Medicare Other | Admitting: Anesthesiology

## 2018-05-12 ENCOUNTER — Encounter: Payer: Self-pay | Admitting: *Deleted

## 2018-05-12 DIAGNOSIS — K922 Gastrointestinal hemorrhage, unspecified: Secondary | ICD-10-CM

## 2018-05-12 DIAGNOSIS — D62 Acute posthemorrhagic anemia: Secondary | ICD-10-CM

## 2018-05-12 DIAGNOSIS — K5731 Diverticulosis of large intestine without perforation or abscess with bleeding: Principal | ICD-10-CM

## 2018-05-12 DIAGNOSIS — K449 Diaphragmatic hernia without obstruction or gangrene: Secondary | ICD-10-CM

## 2018-05-12 HISTORY — PX: ESOPHAGOGASTRODUODENOSCOPY: SHX5428

## 2018-05-12 HISTORY — PX: COLONOSCOPY: SHX5424

## 2018-05-12 LAB — CBC
HCT: 21.9 % — ABNORMAL LOW (ref 36.0–46.0)
HEMATOCRIT: 23.1 % — AB (ref 36.0–46.0)
HEMOGLOBIN: 7.2 g/dL — AB (ref 12.0–15.0)
Hemoglobin: 7.8 g/dL — ABNORMAL LOW (ref 12.0–15.0)
MCH: 31 pg (ref 26.0–34.0)
MCH: 30.2 pg (ref 26.0–34.0)
MCHC: 32.9 g/dL (ref 30.0–36.0)
MCHC: 33.8 g/dL (ref 30.0–36.0)
MCV: 94.4 fL (ref 80.0–100.0)
MCV: 89.5 fL (ref 80.0–100.0)
Platelets: 331 10*3/uL (ref 150–400)
Platelets: 277 10*3/uL (ref 150–400)
RBC: 2.32 MIL/uL — ABNORMAL LOW (ref 3.87–5.11)
RBC: 2.58 MIL/uL — ABNORMAL LOW (ref 3.87–5.11)
RDW: 16 % — ABNORMAL HIGH (ref 11.5–15.5)
RDW: 18.2 % — ABNORMAL HIGH (ref 11.5–15.5)
WBC: 8.2 10*3/uL (ref 4.0–10.5)
WBC: 12 10*3/uL — ABNORMAL HIGH (ref 4.0–10.5)
nRBC: 0.4 % — ABNORMAL HIGH (ref 0.0–0.2)
nRBC: 0.6 % — ABNORMAL HIGH (ref 0.0–0.2)

## 2018-05-12 LAB — BASIC METABOLIC PANEL
Anion gap: 6 (ref 5–15)
BUN: 31 mg/dL — ABNORMAL HIGH (ref 8–23)
CO2: 20 mmol/L — ABNORMAL LOW (ref 22–32)
Calcium: 8 mg/dL — ABNORMAL LOW (ref 8.9–10.3)
Chloride: 110 mmol/L (ref 98–111)
Creatinine, Ser: 1.4 mg/dL — ABNORMAL HIGH (ref 0.44–1.00)
GFR calc Af Amer: 38 mL/min — ABNORMAL LOW (ref >60)
GFR, EST NON AFRICAN AMERICAN: 33 mL/min — AB (ref >60)
Glucose, Bld: 116 mg/dL — ABNORMAL HIGH (ref 70–99)
Potassium: 3.7 mmol/L (ref 3.5–5.1)
Sodium: 136 mmol/L (ref 135–145)

## 2018-05-12 LAB — HEMOGLOBIN: Hemoglobin: 6.8 g/dL — ABNORMAL LOW (ref 12.0–15.0)

## 2018-05-12 LAB — PREPARE RBC (CROSSMATCH)

## 2018-05-12 SURGERY — EGD (ESOPHAGOGASTRODUODENOSCOPY)
Anesthesia: General | Laterality: Left

## 2018-05-12 MED ORDER — LIDOCAINE 2% (20 MG/ML) 5 ML SYRINGE
INTRAMUSCULAR | Status: DC | PRN
  Administered 2018-05-12: 70 mg via INTRAVENOUS

## 2018-05-12 MED ORDER — PROPOFOL 500 MG/50ML IV EMUL
INTRAVENOUS | Status: DC | PRN
  Administered 2018-05-12: 200 ug/kg/min via INTRAVENOUS

## 2018-05-12 MED ORDER — PROPOFOL 10 MG/ML IV BOLUS
INTRAVENOUS | Status: DC | PRN
  Administered 2018-05-12: 70 mg via INTRAVENOUS

## 2018-05-12 MED ORDER — BOOST / RESOURCE BREEZE PO LIQD CUSTOM
1.0000 | Freq: Three times a day (TID) | ORAL | Status: DC
  Administered 2018-05-12: 1 via ORAL

## 2018-05-12 MED ORDER — PROPOFOL 10 MG/ML IV BOLUS
INTRAVENOUS | Status: AC
  Filled 2018-05-12: qty 20

## 2018-05-12 MED ORDER — PHENYLEPHRINE HCL 10 MG/ML IJ SOLN
INTRAMUSCULAR | Status: DC | PRN
  Administered 2018-05-12 (×2): 100 ug via INTRAVENOUS

## 2018-05-12 MED ORDER — SODIUM CHLORIDE 0.9 % IV SOLN
INTRAVENOUS | Status: DC
  Administered 2018-05-12: 1000 mL via INTRAVENOUS

## 2018-05-12 MED ORDER — SODIUM CHLORIDE 0.9% IV SOLUTION: Freq: Once | INTRAVENOUS | Status: DC

## 2018-05-12 NOTE — Progress Notes (Signed)
EGD and colonoscopy postprocedure note  EGD is unremarkable Colonoscopy revealed pancolonic diverticulosis, blood with clots in the entire colon extending all the way to the cecum.  Normal terminal ileum and no bleeding seen in the TI.  The source of bleeding is most likely from diverticuli in the transverse colon which correlates well with bleeding scan  Recommendations Okay with full liquid diet for tonight If bleeding recurs, consult vascular right away Check CBC tonight, transfuse if needed High risk for rebleeding on Eliquis or other form of therapeutic anticoagulation Okay with aspirin 81 mg from GI standpoint  Communicated the findings with patient, and both her sons, Cindie Rajagopalan and Jackquline Denmark, MD [address]  [place]  [place], [place] [address]  Main: [phone]  Fax: [phone] Pager: [phone]

## 2018-05-12 NOTE — Progress Notes (Signed)
Patient having very active gi bleed with large clots. Notified md.

## 2018-05-12 NOTE — Op Note (Signed)
[facility] Patient Name: Olivia [name] Procedure Date: 05/12/2018 4:01 PM MRN: [id number] Account #: [id number] Date of Birth: January 31, 1926 Admit Type: Inpatient Age: 85 Room: [facility] ENDO ROOM 1 Gender: Female Note Status: Finalized Procedure:            Upper GI endoscopy Indications:          Acute post hemorrhagic anemia, Hematochezia Providers:            Lin Landsman MD, MD Referring MD:         Jeraldine Loots, MD (Referring MD) Medicines:            Monitored Anesthesia Care Complications:        No immediate complications. Estimated blood loss: None. Procedure:            Pre-Anesthesia Assessment:                       - Prior to the procedure, a History and Physical was                        performed, and patient medications and allergies were                        reviewed. The patient is competent. The risks and                        benefits of the procedure and the sedation options and                        risks were discussed with the patient. All questions                        were answered and informed consent was obtained.                        Patient identification and proposed procedure were                        verified by the physician, the nurse, the                        anesthesiologist, the anesthetist and the technician in                        the pre-procedure area in the procedure room in the                        endoscopy suite. Mental Status Examination: alert and                        oriented. Airway Examination: normal oropharyngeal                        airway and neck mobility. Respiratory Examination:                        clear to auscultation. CV Examination: normal.                        Prophylactic Antibiotics: The patient does not require  prophylactic antibiotics. Prior Anticoagulants: The                        patient has taken Eliquis (apixaban), last dose was 2                         days prior to procedure. ASA Grade Assessment: III - A                        patient with severe systemic disease. After reviewing                        the risks and benefits, the patient was deemed in                        satisfactory condition to undergo the procedure. The                        anesthesia plan was to use monitored anesthesia care                        (MAC). Immediately prior to administration of                        medications, the patient was re-assessed for adequacy                        to receive sedatives. The heart rate, respiratory rate,                        oxygen saturations, blood pressure, adequacy of                        pulmonary ventilation, and response to care were                        monitored throughout the procedure. The physical status                        of the patient was re-assessed after the procedure.                       After obtaining informed consent, the endoscope was                        passed under direct vision. Throughout the procedure,                        the patient's blood pressure, pulse, and oxygen                        saturations were monitored continuously. The Endoscope                        was introduced through the mouth, and advanced to the                        second part of duodenum. The upper GI endoscopy was  accomplished without difficulty. The patient tolerated                        the procedure well. Findings:      The duodenal bulb and second portion of the duodenum were normal.      The entire examined stomach was normal.      A 2 cm hiatal hernia was present.      The gastroesophageal junction and examined esophagus were normal. Impression:           - Normal duodenal bulb and second portion of the                        duodenum.                       - Normal stomach.                       - 2 cm hiatal hernia.                        - Normal gastroesophageal junction and esophagus.                       - No specimens collected. Recommendation:       - No upper GI source of bleeding identified                       - Proceed with colonoscopy as scheduled                       - See colonoscopy report Procedure Code(s):    --- Professional ---                       [phone], Esophagogastroduodenoscopy, flexible, transoral;                        diagnostic, including collection of specimen(s) by                        brushing or washing, when performed (separate procedure) Diagnosis Code(s):    --- Professional ---                       K44.9, Diaphragmatic hernia without obstruction or                        gangrene                       D62, Acute posthemorrhagic anemia                       K92.1, Melena (includes Hematochezia) CPT copyright 2018 American Medical Association. All rights reserved. The codes documented in this report are preliminary and upon coder review may  be revised to meet current compliance requirements. Dr. Ulyess Mort Lin Landsman MD, MD 05/12/2018 4:29:26 PM This report has been signed electronically. Number of Addenda: 0 Note Initiated On: 05/12/2018 4:01 PM      [facility]

## 2018-05-12 NOTE — Progress Notes (Signed)
[place] [facility] [place] NAME: Olivia [name]    MR#:  [id number]  DATE OF BIRTH:  11/28/1925  SUBJECTIVE:  CHIEF COMPLAINT:   Chief Complaint  Patient presents with  . Rectal Bleeding   Admitted last week and started on Eliquis for A. fib.  Had few episodes of GI bleed and came to hospital.  Noted to have low hemoglobin and received 1 unit of transfusion . Overnight she again had some episodes of dark stools and her hemoglobin is slightly lower today so I am giving more blood transfusion today.  I had seen her once in the morning and discussed with her son in the room and again in the afternoon due to repeated episodes of passing blood clots.  Patient's vitals are stable and she was fully alert and oriented both of the times.  REVIEW OF SYSTEMS:  CONSTITUTIONAL: No fever, have fatigue or weakness.  EYES: No blurred or double vision.  EARS, NOSE, AND THROAT: No tinnitus or ear pain.  RESPIRATORY: No cough, shortness of breath, wheezing or hemoptysis.  CARDIOVASCULAR: No chest pain, orthopnea, edema.  GASTROINTESTINAL: No nausea, vomiting, diarrhea or abdominal pain.  GENITOURINARY: No dysuria, hematuria.  ENDOCRINE: No polyuria, nocturia,  HEMATOLOGY: No anemia, easy bruising or bleeding SKIN: No rash or lesion. MUSCULOSKELETAL: No joint pain or arthritis.   NEUROLOGIC: No tingling, numbness, weakness.  PSYCHIATRY: No anxiety or depression.   ROS  DRUG ALLERGIES:   Allergies  Allergen Reactions  . Ace Inhibitors     Other reaction(s): Cough  . Fentanyl Nausea And Vomiting  . Morphine Other (See Comments)    Other reaction(s): Muscle Pain  . Alendronate     Muscle pain    VITALS:  Blood pressure (!) 132/52, pulse (!) 115, temperature 99.5 F (37.5 C), temperature source Tympanic, resp. rate 18, height 5\' 6"  (1.676 m), weight 74.9 kg, SpO2 100 %.  PHYSICAL EXAMINATION:  GENERAL:  85 y.o.-year-old patient lying in the bed with no acute  distress.  EYES: Pupils equal, round, reactive to light . No scleral icterus. Extraocular muscles intact.  HEENT: Head atraumatic, normocephalic. Oropharynx and nasopharynx clear.  NECK:  Supple, no jugular venous distention. No thyroid enlargement, no tenderness.  LUNGS: Normal breath sounds bilaterally, no wheezing, rales,rhonchi or crepitation. No use of accessory muscles of respiration.  CARDIOVASCULAR: S1, S2 normal.  Systolic murmurs, no rubs, or gallops.  ABDOMEN: Soft, nontender, nondistended. Bowel sounds present. No organomegaly or mass.  EXTREMITIES: No pedal edema, cyanosis, or clubbing.  NEUROLOGIC: Cranial nerves II through XII are intact. Muscle strength 4/5 in all extremities. Sensation intact. Gait not checked.  PSYCHIATRIC: The patient is alert and oriented x 3.  SKIN: No obvious rash, lesion, or ulcer.   Physical Exam LABORATORY PANEL:   CBC Recent Labs  Lab 05/12/18 0141 05/12/18 0742  WBC 8.2  --   HGB 7.2* 6.8*  HCT 21.9*  --   PLT 331  --    ------------------------------------------------------------------------------------------------------------------  Chemistries  Recent Labs  Lab 05/12/18 0141  NA 136  K 3.7  CL 110  CO2 20*  GLUCOSE 116*  BUN 31*  CREATININE 1.40*  CALCIUM 8.0*   ------------------------------------------------------------------------------------------------------------------  Cardiac Enzymes Recent Labs  Lab 05/10/18 1800  TROPONINI <0.03   ------------------------------------------------------------------------------------------------------------------  RADIOLOGY:  Nm Gi Blood Loss  Result Date: 05/11/2018 CLINICAL DATA:  GI bleed, episodes of red blood coming from rectum beginning 24 hours ago, decreased now, heart valve repair placed  on Eliquis last week for possible clot EXAM: NUCLEAR MEDICINE GASTROINTESTINAL BLEEDING SCAN TECHNIQUE: Sequential abdominal images were obtained following intravenous administration of  Tc-16m labeled red blood cells. RADIOPHARMACEUTICALS:  22.07 mCi Tc-68m pertechnetate in-vitro labeled red cells. COMPARISON:  None Correlation: None FINDINGS: Scattered patient motion throughout the 2 hours of imaging. Normal initial blood pool distribution of tracer. During the first hour imaging, no abnormal gastrointestinal localization of tracer is identified. Small amount of de labeled tracer is seen excreted into the urinary bladder. Second hour of imaging demonstrates initially normal blood pool distribution of tracer. During the second hour of imaging, abnormal gastrointestinal localization of tracer is seen at the upper mid abdomen, curvilinear, based on position question gastric versus distal transverse colonic source. IMPRESSION: Abnormal GI bleeding exam demonstrating delayed GI localization of tracer during the second hour of imaging, in a curvilinear fashion located at the upper mid abdomen and LEFT upper quadrant, question gastric versus transverse colonic source. Electronically Signed   By: Lavonia Dana M.D.   On: 05/11/2018 15:29    ASSESSMENT AND PLAN:   Active Problems:   GIB (gastrointestinal bleeding)   85 year old female with history of aortic stenosis status post TAVR,  hyperlipidemia recently hospitalized for SVT who presents with rectal bleeding x3.  *  Rectal bleeding:   per GI continue PPI IV twice daily Serial hemoglobin GI bleeding scan done-no source of bleeding identified. Hold Eliquis GI is planning to do endoscopy and colonoscopy but it may be after 2 days of Eliquis due , she had finished half of the bowel preparation for colonoscopy.  *Acute anemia due to blood loss Transfuse 1 unit of PRBC 05/11/18.  Continue to follow serial hemoglobin. Hemoglobin dropped and having recurrent episodes of blood clots per rectum so we will give 1 more unit 05/12/18.  *Atrial fibrillation Currently rate is controlled, hold Eliquis and we may not be able to resume due to her GI  bleed and old age. GI has requested to get cardiology clearance for the procedure of colonoscopy. Appreciated held by Dr. Towanda Malkin.  *  Hypothyroidism: Continue Synthroid  *  Hyperlipidemia: Continue statin    All the records are reviewed and case discussed with Care Management/Social Workerr. Management plans discussed with the patient, family and they are in agreement.  CODE STATUS: Full  TOTAL TIME TAKING CARE OF THIS PATIENT: 55 minutes.     POSSIBLE D/C IN 1-2 DAYS, DEPENDING ON CLINICAL CONDITION.   [name] M.D on 05/12/2018   Between 7am to 6pm - Pager - [phone]  After 6pm go to [email or web address] - password [email or web address] [facility]  Office  [phone]  CC: Primary care physician; Rusty Aus, MD  Note: This dictation was prepared with Dragon dictation along with smaller phrase technology. Any transcriptional errors that result from this process are unintentional.

## 2018-05-12 NOTE — Op Note (Signed)
[facility] Patient Name: Olivia [name] Procedure Date: 05/12/2018 4:00 PM MRN: [id number] Account #: [id number] Date of Birth: Apr 13, 1925 Admit Type: Inpatient Age: 85 Room: [facility] ENDO ROOM 1 Gender: Female Note Status: Finalized Procedure:            Colonoscopy Indications:          Hematochezia Providers:            Lin Landsman MD, MD Referring MD:         Jeraldine Loots, MD (Referring MD) Medicines:            Monitored Anesthesia Care Complications:        No immediate complications. Estimated blood loss: None. Procedure:            Pre-Anesthesia Assessment:                       - Prior to the procedure, a History and Physical was                        performed, and patient medications and allergies were                        reviewed. The patient is competent. The risks and                        benefits of the procedure and the sedation options and                        risks were discussed with the patient. All questions                        were answered and informed consent was obtained.                        Patient identification and proposed procedure were                        verified by the physician, the nurse, the                        anesthesiologist, the anesthetist and the technician in                        the pre-procedure area in the procedure room in the                        endoscopy suite. Mental Status Examination: alert and                        oriented. Airway Examination: normal oropharyngeal                        airway and neck mobility. Respiratory Examination:                        clear to auscultation. CV Examination: normal.                        Prophylactic Antibiotics: The patient does not require  prophylactic antibiotics. Prior Anticoagulants: The                        patient has taken Eliquis (apixaban), last dose was 2                        days prior to  procedure. ASA Grade Assessment: III - A                        patient with severe systemic disease. After reviewing                        the risks and benefits, the patient was deemed in                        satisfactory condition to undergo the procedure. The                        anesthesia plan was to use monitored anesthesia care                        (MAC). Immediately prior to administration of                        medications, the patient was re-assessed for adequacy                        to receive sedatives. The heart rate, respiratory rate,                        oxygen saturations, blood pressure, adequacy of                        pulmonary ventilation, and response to care were                        monitored throughout the procedure. The physical status                        of the patient was re-assessed after the procedure.                       After obtaining informed consent, the colonoscope was                        passed under direct vision. Throughout the procedure,                        the patient's blood pressure, pulse, and oxygen                        saturations were monitored continuously. The                        Colonoscope was introduced through the anus and                        advanced to the the terminal ileum, with identification  of the appendiceal orifice and IC valve. The                        colonoscopy was performed without difficulty. The                        patient tolerated the procedure well. The quality of                        the bowel preparation was adequate. Findings:      The perianal and digital rectal examinations were normal. Pertinent       negatives include normal sphincter tone and no palpable rectal lesions.      The terminal ileum appeared normal, no blood is seen.      Multiple diverticula were found in the entire colon. There was no       evidence of active diverticular bleeding,  but most likely the souce of       GI bleed from transverse colon diverticula.      Red blood with blood clots from recent bleeding was found in the entire       colon extending upto cecum, extensive lavage was performed with 1029m       of sterile water. Actively bleeding diverticulum or stigmata of recent       bleeding in the diverticulum is not identified despite extensive lavage.      The retroflexed view of the distal rectum and anal verge was normal and       showed no anal or rectal abnormalities. Impression:           - The examined portion of the ileum was normal.                       - Severe diverticulosis in the entire examined colon,                        likely source of GI bleed. There was no evidence of                        active diverticular bleeding.                       - Blood in the entire examined colon.                       - The distal rectum and anal verge are normal on                        retroflexion view.                       - No specimens collected. Recommendation:       - Return patient to hospital ward for ongoing care.                       - Full liquid diet today.                       - Continue present medications.                       - Do a GI bleeding (  tagged RBC) scan if bleeding recurs                        and consult vascular.                       - High risk for rebleeding on anticoagulation                       - Monitor CBC closely Procedure Code(s):    --- Professional ---                       [phone], Colonoscopy, flexible; diagnostic, including                        collection of specimen(s) by brushing or washing, when                        performed (separate procedure) Diagnosis Code(s):    --- Professional ---                       K92.2, Gastrointestinal hemorrhage, unspecified                       K92.1, Melena (includes Hematochezia)                       K57.30, Diverticulosis of large intestine without                         perforation or abscess without bleeding CPT copyright 2018 American Medical Association. All rights reserved. The codes documented in this report are preliminary and upon coder review may  be revised to meet current compliance requirements. Dr. Ulyess Mort Lin Landsman MD, MD 05/12/2018 5:11:52 PM This report has been signed electronically. Number of Addenda: 0 Note Initiated On: 05/12/2018 4:00 PM Scope Withdrawal Time: 0 hours 23 minutes 26 seconds  Total Procedure Duration: 0 hours 29 minutes 22 seconds       [facility]

## 2018-05-12 NOTE — Anesthesia Preprocedure Evaluation (Signed)
Anesthesia Evaluation  Patient identified by MRN, date of birth, ID band Patient awake    Reviewed: Allergy & Precautions, NPO status , Patient's Chart, lab work & pertinent test results, reviewed documented beta blocker date and time   Airway Mallampati: II  TM Distance: >3 FB     Dental  (+) Chipped   Pulmonary           Cardiovascular hypertension, Pt. on medications and Pt. on home beta blockers + dysrhythmias Atrial Fibrillation + pacemaker      Neuro/Psych    GI/Hepatic   Endo/Other  Hypothyroidism   Renal/GU      Musculoskeletal   Abdominal   Peds  Hematology   Anesthesia Other Findings Aortic stenosis - moderate. Hx of SVT. Hb 6.8. Has received 2 units. EKG shows Rbbb, otherwise ok.  DOES NOT WANT FENTANYL.   Reproductive/Obstetrics                             Anesthesia Physical Anesthesia Plan  ASA: III  Anesthesia Plan: General   Post-op Pain Management:    Induction: Intravenous  PONV Risk Score and Plan:   Airway Management Planned:   Additional Equipment:   Intra-op Plan:   Post-operative Plan:   Informed Consent: I have reviewed the patients History and Physical, chart, labs and discussed the procedure including the risks, benefits and alternatives for the proposed anesthesia with the patient or authorized representative who has indicated his/her understanding and acceptance.       Plan Discussed with: CRNA  Anesthesia Plan Comments:         Anesthesia Quick Evaluation

## 2018-05-12 NOTE — Anesthesia Post-op Follow-up Note (Signed)
Anesthesia QCDR form completed.        

## 2018-05-12 NOTE — Transfer of Care (Signed)
Immediate Anesthesia Transfer of Care Note  Patient: Olivia [name]  Procedure(s) Performed: ESOPHAGOGASTRODUODENOSCOPY (EGD) (Left ) COLONOSCOPY (Left )  Patient Location: Endoscopy Unit  Anesthesia Type:General  Level of Consciousness: sedated  Airway & Oxygen Therapy: Patient connected to nasal cannula oxygen  Post-op Assessment: Post -op Vital signs reviewed and stable  Post vital signs: stable  Last Vitals:  Vitals Value Taken Time  BP    Temp    Pulse    Resp    SpO2      Last Pain:  Vitals:   05/12/18 1556  TempSrc: Tympanic  PainSc:          Complications: No apparent anesthesia complications

## 2018-05-12 NOTE — Progress Notes (Signed)
Dr called about patient hemoglobin of 7.2. No new orders given, will continue to monitor.

## 2018-05-12 NOTE — Progress Notes (Signed)
Dr. Anselm Jungling called me around 2:30 PM as patient is passing blood with large clots and she is receiving second unit of PRBCs.  Her vitals have been stable.  Patient is actually scheduled for EGD and possible colonoscopy for today.  Her bleeding scan is indeterminate given abnormal gastrointestinal localization of tracer in the upper midabdomen raising the question gastric versus distal transverse colon source.  Therefore, I discussed my recommendations about proceeding with upper endoscopy as well as a colonoscopy with patient, her older son and her second son Dr. Shellia Cleverly who is an OB/GYN physician as well as state secretory of health in [place] over phone.  He is agreeable for his mom to undergo upper endoscopy and possible colonoscopy if unable to locate a source of bleeding on upper endoscopy.  Patient will sign consent form for EGD and colonoscopy  Cephas Darby, MD [address]  [place]  [place], [place] [address]  Main: [phone]  Fax: [phone] Pager: [phone]

## 2018-05-12 NOTE — Progress Notes (Signed)
PT Cancellation Note  Patient Details Name: Olivia [name] MRN: [id number] DOB: 1925/05/07   Cancelled Treatment:    Reason Eval/Treat Not Completed: Medical issues which prohibited therapy.  Continues to be unable to work with PT and will try again tomorrow.   Ramond Dial 05/12/2018, 12:31 PM  Mee Hives, PT MS Acute Rehab Dept. Number: [place] [facility] [place]

## 2018-05-12 NOTE — Anesthesia Postprocedure Evaluation (Signed)
Anesthesia Post Note  Patient: Olivia [name]  Procedure(s) Performed: ESOPHAGOGASTRODUODENOSCOPY (EGD) (Left ) COLONOSCOPY (Left )  Patient location during evaluation: Endoscopy Anesthesia Type: General Level of consciousness: awake and alert Pain management: pain level controlled Vital Signs Assessment: post-procedure vital signs reviewed and stable Respiratory status: spontaneous breathing, nonlabored ventilation, respiratory function stable and patient connected to nasal cannula oxygen Cardiovascular status: blood pressure returned to baseline and stable Postop Assessment: no apparent nausea or vomiting Anesthetic complications: no     Last Vitals:  Vitals:   05/12/18 1726 05/12/18 1736  BP: 138/78 (!) 139/53  Pulse: 95 89  Resp: 16 20  Temp:    SpO2: 95% 97%    Last Pain:  Vitals:   05/12/18 1706  TempSrc: Tympanic  PainSc:                  Ziaire Bieser S

## 2018-05-12 NOTE — Progress Notes (Signed)
Patient still actively bleeding.  Informed MD.  Started blood transfustion. Patient asymptomatic at this time.

## 2018-05-12 NOTE — Progress Notes (Signed)
PT Cancellation Note  Patient Details Name: Olivia [name] MRN: [id number] DOB: 27-Jun-1925   Cancelled Treatment:    Reason Eval/Treat Not Completed: Medical issues which prohibited therapy.  Hgb is dropping since early AM so will try again at another time.     Ramond Dial 05/12/2018, 9:33 AM   Mee Hives, PT MS Acute Rehab Dept. Number: [place]

## 2018-05-13 ENCOUNTER — Encounter
Admission: RE | Admit: 2018-05-13 | Discharge: 2018-05-13 | Disposition: A | Discharge status: Home / Self Care | Payer: Medicare Other | Source: Ambulatory Visit | Attending: Internal Medicine | Admitting: Internal Medicine

## 2018-05-13 ENCOUNTER — Encounter: Payer: Self-pay | Admitting: Gastroenterology

## 2018-05-13 LAB — BASIC METABOLIC PANEL
Anion gap: 10 (ref 5–15)
BUN: 29 mg/dL — AB (ref 8–23)
CO2: 18 mmol/L — ABNORMAL LOW (ref 22–32)
CREATININE: 1.25 mg/dL — AB (ref 0.44–1.00)
Calcium: 7.6 mg/dL — ABNORMAL LOW (ref 8.9–10.3)
Chloride: 110 mmol/L (ref 98–111)
GFR calc Af Amer: 44 mL/min — ABNORMAL LOW (ref >60)
GFR calc non Af Amer: 38 mL/min — ABNORMAL LOW (ref >60)
Glucose, Bld: 110 mg/dL — ABNORMAL HIGH (ref 70–99)
Potassium: 3.9 mmol/L (ref 3.5–5.1)
SODIUM: 138 mmol/L (ref 135–145)

## 2018-05-13 LAB — CBC
HCT: 20 % — ABNORMAL LOW (ref 36.0–46.0)
Hemoglobin: 6.9 g/dL — ABNORMAL LOW (ref 12.0–15.0)
MCH: 30.4 pg (ref 26.0–34.0)
MCHC: 34.5 g/dL (ref 30.0–36.0)
MCV: 88.1 fL (ref 80.0–100.0)
Platelets: 242 10*3/uL (ref 150–400)
RBC: 2.27 MIL/uL — ABNORMAL LOW (ref 3.87–5.11)
RDW: 18.6 % — ABNORMAL HIGH (ref 11.5–15.5)
WBC: 11.1 10*3/uL — ABNORMAL HIGH (ref 4.0–10.5)
nRBC: 0.7 % — ABNORMAL HIGH (ref 0.0–0.2)

## 2018-05-13 LAB — HEMOGLOBIN
Hemoglobin: 8.3 g/dL — ABNORMAL LOW (ref 12.0–15.0)
Hemoglobin: 8.7 g/dL — ABNORMAL LOW (ref 12.0–15.0)

## 2018-05-13 LAB — PREPARE RBC (CROSSMATCH)

## 2018-05-13 MED ORDER — SODIUM CHLORIDE 0.9% IV SOLUTION
Freq: Once | INTRAVENOUS | Status: AC
  Administered 2018-05-13: 03:00:00 via INTRAVENOUS

## 2018-05-13 MED ORDER — PANTOPRAZOLE SODIUM 40 MG PO TBEC: 40.0000 mg | DELAYED_RELEASE_TABLET | Freq: Every day | ORAL | 1 refills | Status: DC

## 2018-05-13 NOTE — Progress Notes (Signed)
Patient seen briefly last night. Discussed that if she has significant bleeding we can offer embolization Hgb 6.9 this am.   Will be available for embolization today if needed

## 2018-05-13 NOTE — Progress Notes (Signed)
Report called to receiving nurse: Eustaquio Maize, RN at [facility] ([place]). Informed of patient current status, labs results, VS and orders from hospitalization. Instructed Beth to call nursing unit with further questions, if needed. Receiving RN acknowledged and agreed.

## 2018-05-13 NOTE — Evaluation (Signed)
Physical Therapy Evaluation Patient Details Name: Olivia [name] MRN: [id number] DOB: [birth year] Today's Date: 05/13/2018   History of Present Illness  From MD H&P: Pt is a 85 y.o. female with a known history of aortic stenosis status post TAVR, hypothyroidism and hypertension who presented to the emergency room due to bleeding.  Patient is on Eliquis.  Patient reported dizziness.  She denied chest pain or shortness of breath.  Assessment includes: rectal bleeding, acute anemia due to blood loss, A-fib, hypothyroidism, and HLD.     Clinical Impression  Pt's Hgb noted to be up to 8.3 from 6.9.  Spoke to Dr. Anselm Jungling who gave verbal ok to work with pt without restrictions. Pt presents with minor deficits in strength, transfers, and gait, and with mod deficits in activity tolerance but overall performed very well during the session.  Pt was Ind with bed mobility tasks and SBA with transfers with good control and stability.  Pt was able to amb 100' with a RW with slow cadence and short B step length but was steady without LOB.  Pt's SpO2 and HR monitored frequently during session and WNL throughout with no adverse symptoms noted other than mild fatigue after amb.  Pt reported feeling steadier with a RW during amb and would benefit from HHPT services upon discharge to address the above deficits for decreased caregiver assistance and return to PLOF.      Follow Up Recommendations Home health PT    Equipment Recommendations  Rolling walker with 5" wheels    Recommendations for Other Services       Precautions / Restrictions Precautions Precautions: Fall Restrictions Weight Bearing Restrictions: No      Mobility  Bed Mobility Overal bed mobility: Independent                Transfers Overall transfer level: Needs assistance Equipment used: Rolling walker (2 wheeled) Transfers: Sit to/from Stand Sit to Stand: Supervision         General transfer comment: Good eccentric and  concentric control with transfers with no noted instability  Ambulation/Gait Ambulation/Gait assistance: Min guard Gait Distance (Feet): 100 Feet Assistive device: Rolling walker (2 wheeled) Gait Pattern/deviations: Step-through pattern;Decreased step length - right;Decreased step length - left Gait velocity: decreased   General Gait Details: Slow cadence with gait but steady without LOB  Stairs            Wheelchair Mobility    Modified Rankin (Stroke Patients Only)       Balance Overall balance assessment: No apparent balance deficits (not formally assessed)                                           Pertinent Vitals/Pain Pain Assessment: No/denies pain    Home Living Family/patient expects to be discharged to:: Private residence Living Arrangements: Alone Available Help at Discharge: Available 24 hours/day;Skilled [place];Other (Comment)(Pt planning on discharging to SNF setting associated with her [facility]) Type of Home: Independent living facility Home Access: Level entry     Home Layout: One level Home Equipment: Cane - single point      Prior Function Level of Independence: Independent         Comments: Ind amb limited community distances without an AD, no fall history, Ind with ADLs     Hand Dominance        Extremity/Trunk Assessment   Upper  Extremity Assessment Upper Extremity Assessment: Overall WFL for tasks assessed    Lower Extremity Assessment Lower Extremity Assessment: Generalized weakness       Communication   Communication: No difficulties  Cognition Arousal/Alertness: Awake/alert Behavior During Therapy: WFL for tasks assessed/performed Overall Cognitive Status: Within Functional Limits for tasks assessed                                        General Comments      Exercises Total Joint Exercises Ankle Circles/Pumps: Strengthening;Both;10 reps Quad Sets: Strengthening;Both;5  reps;10 reps Heel Slides: AROM;Both;10 reps Hip ABduction/ADduction: AROM;Both;5 reps Long Arc Quad: Strengthening;Both;10 reps;15 reps Knee Flexion: Strengthening;Both;10 reps;15 reps Marching in Standing: AROM;Both;10 reps;Standing   Assessment/Plan    PT Assessment Patient needs continued PT services  PT Problem List Decreased strength;Decreased activity tolerance       PT Treatment Interventions DME instruction;Gait training;Functional mobility training;Therapeutic activities;Therapeutic exercise;Balance training;Patient/family education    PT Goals (Current goals can be found in the Care Plan section)  Acute Rehab PT Goals Patient Stated Goal: To get stronger and get home PT Goal Formulation: With patient Time For Goal Achievement: 05/26/18 Potential to Achieve Goals: Good    Frequency Min 2X/week   Barriers to discharge        Co-evaluation               AM-PAC PT "6 Clicks" Mobility  Outcome Measure Help needed turning from your back to your side while in a flat bed without using bedrails?: None Help needed moving from lying on your back to sitting on the side of a flat bed without using bedrails?: None Help needed moving to and from a bed to a chair (including a wheelchair)?: A Little Help needed standing up from a chair using your arms (e.g., wheelchair or bedside chair)?: A Little Help needed to walk in hospital room?: A Little Help needed climbing 3-5 steps with a railing? : A Little 6 Click Score: 20    End of Session Equipment Utilized During Treatment: Gait belt Activity Tolerance: Patient tolerated treatment well Patient left: in chair;with chair alarm set;with call bell/phone within reach;with family/visitor present Nurse Communication: Mobility status PT Visit Diagnosis: Muscle weakness (generalized) (M62.81);Difficulty in walking, not elsewhere classified (R26.2)    Time: 5732-2025 PT Time Calculation (min) (ACUTE ONLY): 38 min   Charges:    PT Evaluation $PT Eval Low Complexity: 1 Low PT Treatments $Therapeutic Exercise: 8-22 mins        D. Scott Maurica Omura PT, DPT 05/13/18, 1:23 PM

## 2018-05-13 NOTE — Clinical Social Work Placement (Signed)
   CLINICAL SOCIAL WORK PLACEMENT  NOTE  Date:  05/13/2018  Patient Details  Name: Olivia [name] MRN: [id number] Date of Birth: 12-04-1925  Clinical Social Work is seeking post-discharge placement for this patient at the [place] level of care (*CSW will initial, date and re-position this form in  chart as items are completed):  Yes   Patient/family provided with [place] Work Department's list of facilities offering this level of care within the geographic area requested by the patient (or if unable, by the patient's family).  Yes   Patient/family informed of their freedom to choose among providers that offer the needed level of care, that participate in Medicare, Medicaid or managed care program needed by the patient, have an available bed and are willing to accept the patient.  Yes   Patient/family informed of [facility]'s ownership interest in [facility], as well as of the fact that they are under no obligation to receive care at these facilities.  PASRR submitted to EDS on 05/11/18     PASRR number received on 05/11/18     Existing PASRR number confirmed on       FL2 transmitted to all facilities in geographic area requested by pt/family on 05/11/18     FL2 transmitted to all facilities within larger geographic area on       Patient informed that his/her managed care company has contracts with or will negotiate with certain facilities, including the following:        Yes   Patient/family informed of bed offers received.  Patient chooses bed at [facility] )     Physician recommends and patient chooses bed at      Patient to be transferred to [facility] ) on 05/13/18.  Patient to be transferred to facility by (Patient's son Truman Hayward will transport. )     Patient family notified on 05/13/18 of transfer.  Name of family member notified:  (Patient's son Truman Hayward is at bedside and aware of D/C today. )      PHYSICIAN       Additional Comment:    _______________________________________________ Darsh Vandevoort, Veronia Beets, LCSW 05/13/2018, 4:11 PM

## 2018-05-13 NOTE — Progress Notes (Signed)
PT Cancellation Note  Patient Details Name: Olivia [name] MRN: [id number] DOB: [birth year]   Cancelled Treatment:    Reason Eval/Treat Not Completed: Medical issues which prohibited therapy.  Pt's Hgb 6.9 and trending down.  Will hold PT evaluation at this time and will attempt to see pt at a future date/time as medically appropriate.    Linus Salmons PT, DPT 05/13/18, 8:27 AM

## 2018-05-13 NOTE — Progress Notes (Signed)
If no complications, per MD patient can discharge after 1600. Son at bedside updated. Madlyn Frankel, RN

## 2018-05-13 NOTE — Progress Notes (Signed)
AVS reviewed with patient and her son, Truman Hayward. IV and telemetry monitor removed. Patient gown and hospital knit brief provided, per patient request. Volunteer contacted to transport patient to visitor's entrance, via wheelchair. Patient's son to transport patient to [facility].

## 2018-05-13 NOTE — Care Management Important Message (Signed)
Important Message  Patient Details  Name: RHYLIN VENTERS MRN: [id number] Date of Birth: 10-Jan-1926   Medicare Important Message Given:  Yes    Juliann Pulse A Pearlie Lafosse 05/13/2018, 1:22 PM

## 2018-05-13 NOTE — Progress Notes (Signed)
Patient is medically stable for D/C to [facility] today. Per [facility] admissions coordinator at [facility] patient can come today to room 350 under respite days. RN will call report at [phone] and patient's son Truman Hayward will transport. Clinical Education officer, museum (CSW) sent D/C orders to [employer] via [employer]. Patient is aware of above. Patient's son Truman Hayward is aware of above. Please reconsult if future social work needs arise. CSW signing off.   [employer], LCSW [phone]

## 2018-05-13 NOTE — Discharge Summary (Signed)
[place] [facility] [place] NAME: Olivia [name]    MR#:  [id number]  DATE OF BIRTH:  [birth year]  DATE OF ADMISSION:  05/10/2018 ADMITTING PHYSICIAN: Bettey Costa, MD  DATE OF DISCHARGE: 05/13/2018   PRIMARY CARE PHYSICIAN: Rusty Aus, MD    ADMISSION DIAGNOSIS:  Acute GI bleeding [K92.2]  DISCHARGE DIAGNOSIS:  Active Problems:   GIB (gastrointestinal bleeding)   SECONDARY DIAGNOSIS:   Past Medical History:  Diagnosis Date  . Acquired hypothyroidism 08/09/2015  . Aortic stenosis, moderate 02/07/2015   Stable 5/17, slightly worse 5/18  . Atrial fibrillation ([place])   . Benign essential hypertension 08/09/2015  . Chicken pox   . Colon polyps   . Hyperlipidemia, mixed 08/09/2015  . Medicare annual wellness visit, initial 02/11/2016   12/17,5/18, 6/19  12/17,5/18  . SCC (squamous cell carcinoma), face 06/19/2016    HOSPITAL COURSE:   85 year old female with history of aortic stenosis status post TAVR, hyperlipidemia recently hospitalized for SVT who presents with rectal bleeding x3.  *Rectal bleeding:   per GI continue PPI IV twice daily Serial hemoglobin GI bleeding scan done-no source of bleeding identified. Hold Eliquis EGD and colonoscopy was done by GI 05/12/18-noted to have multiple diverticuli possible source of bleed in transverse colon.  No active bleeding noted during the procedure. Patient remained stable after the procedure.  Required total 2 units of blood transfusion during the hospitalization.  Hemoglobin more than 8 at the time of discharge as recommended by cardiologist.  Tolerating diet.  We advised to follow in GI and cardiology clinic within 1 to 2 weeks.  She may need to be started on aspirin but not Eliquis.  *Acute anemia due to blood loss Transfuse 1 unit of PRBC 05/11/18.  Continue to follow serial hemoglobin. Hemoglobin dropped and having recurrent episodes of blood clots per rectum so we will give 1 more unit  05/12/18. Received 1 more unit on 05/13/18-due to likely dilution with IV fluids.  No more bleeding episodes since the procedure.  Recheck hemoglobin more than 8.  *Atrial fibrillation Currently rate is controlled, hold Eliquis and we may not be able to resume due to her GI bleed and old age. GI has requested to get cardiology clearance for the procedure of colonoscopy. Appreciated held by Dr. Towanda Malkin.  * Hypothyroidism: Continue Synthroid  * Hyperlipidemia: Continue statin   DISCHARGE CONDITIONS:   Stable  CONSULTS OBTAINED:  Treatment Team:  Lin Landsman, MD Jonathon Bellows, MD Yolonda Kida, MD  DRUG ALLERGIES:   Allergies  Allergen Reactions  . Ace Inhibitors     Other reaction(s): Cough  . Fentanyl Nausea And Vomiting  . Morphine Other (See Comments)    Other reaction(s): Muscle Pain  . Alendronate     Muscle pain    DISCHARGE MEDICATIONS:   Allergies as of 05/13/2018      Reactions   Ace Inhibitors    Other reaction(s): Cough   Fentanyl Nausea And Vomiting   Morphine Other (See Comments)   Other reaction(s): Muscle Pain   Alendronate    Muscle pain      Medication List    STOP taking these medications   apixaban 5 MG Tabs tablet Commonly known as:  ELIQUIS   sulfamethoxazole-trimethoprim 800-160 MG tablet Commonly known as:  BACTRIM DS,SEPTRA DS     TAKE these medications   A+D DIAPER RASH Crea Apply topically daily.   albuterol 108 (90 Base) MCG/ACT inhaler Commonly known  as:  PROVENTIL HFA;VENTOLIN HFA Inhale 2 puffs into the lungs every 6 (six) hours as needed for wheezing or shortness of breath.   clobetasol cream 0.05 % Commonly known as:  TEMOVATE APPLY TOPICALLY 3 TIMES A WEEK   ferrous gluconate 324 MG tablet Commonly known as:  FERGON Take 324 mg by mouth daily.   furosemide 20 MG tablet Commonly known as:  LASIX Take 20 mg by mouth daily.   levothyroxine 50 MCG tablet Commonly known as:  SYNTHROID,  LEVOTHROID TAKE ONE TABLET BY MOUTH DAILY   metoprolol succinate 25 MG 24 hr tablet Commonly known as:  TOPROL XL Take 1 tablet (25 mg total) by mouth daily.   MULTIVITAMIN ADULT Tabs Take 1 tablet by mouth daily.   pantoprazole 40 MG tablet Commonly known as:  PROTONIX Take 1 tablet (40 mg total) by mouth daily.   vitamin A & D ointment Apply 1 application topically daily.        DISCHARGE INSTRUCTIONS:    Advised to follow her hemoglobin in the next 2 to 3 days. If any complaints of abdominal pain or bleeding episodes then she may need to come back to the emergency room right away.  She will need to restart her aspirin after a week, I would advised to follow with cardiologist in the clinic to get their opinion.  She should not take Eliquis or any other stronger anticoagulants because of this major GI bleed issue.  If you experience worsening of your admission symptoms, develop shortness of breath, life threatening emergency, suicidal or homicidal thoughts you must seek medical attention immediately by calling 911 or calling your MD immediately  if symptoms less severe.  You Must read complete instructions/literature along with all the possible adverse reactions/side effects for all the Medicines you take and that have been prescribed to you. Take any new Medicines after you have completely understood and accept all the possible adverse reactions/side effects.   Please note  You were cared for by a hospitalist during your hospital stay. If you have any questions about your discharge medications or the care you received while you were in the hospital after you are discharged, you can call the unit and asked to speak with the hospitalist on call if the hospitalist that took care of you is not available. Once you are discharged, your primary care physician will handle any further medical issues. Please note that NO REFILLS for any discharge medications will be authorized once you are  discharged, as it is imperative that you return to your primary care physician (or establish a relationship with a primary care physician if you do not have one) for your aftercare needs so that they can reassess your need for medications and monitor your lab values.    Today   CHIEF COMPLAINT:   Chief Complaint  Patient presents with  . Rectal Bleeding    HISTORY OF PRESENT ILLNESS:  Olivia [name]  is a 85 y.o. female with a known history of aortic stenosis status post TAVR, hypothyroidism and hypertension who presents to the emergency room due to 2 bleeding x3.  Patient is on Eliquis.  Patient reports dizziness.  She denies chest pain or shortness of breath.  VITAL SIGNS:  Blood pressure 136/61, pulse 82, temperature 97.9 F (36.6 C), temperature source Oral, resp. rate 16, height 5\' 6"  (1.676 m), weight 74.9 kg, SpO2 100 %.  I/O:    Intake/Output Summary (Last 24 hours) at 05/13/2018 1306 Last data filed at  05/12/2018 1530 Gross per 24 hour  Intake 266.67 ml  Output -  Net 266.67 ml    PHYSICAL EXAMINATION:   GENERAL:  85 y.o.-year-old patient lying in the bed with no acute distress.  EYES: Pupils equal, round, reactive to light . No scleral icterus. Extraocular muscles intact.  HEENT: Head atraumatic, normocephalic. Oropharynx and nasopharynx clear.  NECK:  Supple, no jugular venous distention. No thyroid enlargement, no tenderness.  LUNGS: Normal breath sounds bilaterally, no wheezing, rales,rhonchi or crepitation. No use of accessory muscles of respiration.  CARDIOVASCULAR: S1, S2 normal.  Systolic murmurs, no rubs, or gallops.  ABDOMEN: Soft, nontender, nondistended. Bowel sounds present. No organomegaly or mass.  EXTREMITIES: No pedal edema, cyanosis, or clubbing.  NEUROLOGIC: Cranial nerves II through XII are intact. Muscle strength 4/5 in all extremities. Sensation intact. Gait not checked.  PSYCHIATRIC: The patient is alert and oriented x 3.  SKIN: No obvious rash,  lesion, or ulcer.   DATA REVIEW:   CBC Recent Labs  Lab 05/13/18 0146 05/13/18 0835  WBC 11.1*  --   HGB 6.9* 8.3*  HCT 20.0*  --   PLT 242  --     Chemistries  Recent Labs  Lab 05/13/18 0146  NA 138  K 3.9  CL 110  CO2 18*  GLUCOSE 110*  BUN 29*  CREATININE 1.25*  CALCIUM 7.6*    Cardiac Enzymes Recent Labs  Lab 05/10/18 1800  TROPONINI <0.03    Microbiology Results  Results for orders placed or performed during the hospital encounter of 05/04/18  Urine culture     Status: Abnormal   Collection Time: 05/04/18 12:11 PM  Result Value Ref Range Status   Specimen Description   Final    URINE, RANDOM Performed at [facility], [address], [place], [place] [address]    Special Requests   Final    NONE Performed at [facility], [address] [place], [place] [address]    Culture >=100,000 COLONIES/mL ENTEROBACTER CLOACAE (A)  Final   Report Status 05/08/2018 FINAL  Final   Organism ID, Bacteria ENTEROBACTER CLOACAE (A)  Final      Susceptibility   Enterobacter cloacae - MIC*    CEFAZOLIN >=64 RESISTANT Resistant     CEFTRIAXONE <=1 SENSITIVE Sensitive     CIPROFLOXACIN <=0.25 SENSITIVE Sensitive     GENTAMICIN <=1 SENSITIVE Sensitive     IMIPENEM 1 SENSITIVE Sensitive     NITROFURANTOIN <=16 SENSITIVE Sensitive     TRIMETH/SULFA <=20 SENSITIVE Sensitive     PIP/TAZO <=4 SENSITIVE Sensitive     * >=100,000 COLONIES/mL ENTEROBACTER CLOACAE  MRSA PCR Screening     Status: None   Collection Time: 05/04/18  9:10 PM  Result Value Ref Range Status   MRSA by PCR NEGATIVE NEGATIVE Final    Comment:        The GeneXpert MRSA Assay (FDA approved for NASAL specimens only), is one component of a comprehensive MRSA colonization surveillance program. It is not intended to diagnose MRSA infection nor to guide or monitor treatment for MRSA infections. Performed at [facility], [address] [place], [place] [address]      RADIOLOGY:  Nm Gi Blood Loss  Result Date: 05/11/2018 CLINICAL DATA:  GI bleed, episodes of red blood coming from rectum beginning 24 hours ago, decreased now, heart valve repair placed on Eliquis last week for possible clot EXAM: NUCLEAR MEDICINE GASTROINTESTINAL BLEEDING SCAN TECHNIQUE: Sequential abdominal images were obtained following intravenous  administration of Tc-58m labeled red blood cells. RADIOPHARMACEUTICALS:  22.07 mCi Tc-68m pertechnetate in-vitro labeled red cells. COMPARISON:  None Correlation: None FINDINGS: Scattered patient motion throughout the 2 hours of imaging. Normal initial blood pool distribution of tracer. During the first hour imaging, no abnormal gastrointestinal localization of tracer is identified. Small amount of de labeled tracer is seen excreted into the urinary bladder. Second hour of imaging demonstrates initially normal blood pool distribution of tracer. During the second hour of imaging, abnormal gastrointestinal localization of tracer is seen at the upper mid abdomen, curvilinear, based on position question gastric versus distal transverse colonic source. IMPRESSION: Abnormal GI bleeding exam demonstrating delayed GI localization of tracer during the second hour of imaging, in a curvilinear fashion located at the upper mid abdomen and LEFT upper quadrant, question gastric versus transverse colonic source. Electronically Signed   By: Lavonia Dana M.D.   On: 05/11/2018 15:29    EKG:   Orders placed or performed during the hospital encounter of 05/10/18  . ED EKG  . ED EKG  . EKG 12-Lead  . EKG 12-Lead      Management plans discussed with the patient, family and they are in agreement.  CODE STATUS:     Code Status Orders  (From admission, onward)         Start     Ordered   05/11/18 1535  Full code  Continuous     05/11/18 1534        Code Status History    Date Active Date Inactive Code Status Order ID Comments User Context   05/10/2018 1944  05/11/2018 1534 DNR [id number]  Bettey Costa, MD ED   05/04/2018 1824 05/05/2018 1508 Full Code [id number]  Henreitta Leber, MD ED    Advance Directive Documentation     Most Recent Value  Type of Advance Directive  Healthcare Power of Attorney  Pre-existing out of facility DNR order (yellow form or pink MOST form)  -  "MOST" Form in Place?  -      TOTAL TIME TAKING CARE OF THIS PATIENT: 35 minutes.    [name] M.D on 05/13/2018 at 1:06 PM  Between 7am to 6pm - Pager - [phone]  After 6pm go to [email or web address] - password [email or web address] [facility]  Office  [phone]  CC: Primary care physician; Rusty Aus, MD   Note: This dictation was prepared with Dragon dictation along with smaller phrase technology. Any transcriptional errors that result from this process are unintentional.

## 2018-05-13 NOTE — Discharge Instructions (Signed)
May need to start aspirin in 1 week after talking to GI and cardiologist.

## 2018-05-14 ENCOUNTER — Encounter: Payer: Self-pay | Admitting: Adult Health

## 2018-05-14 ENCOUNTER — Non-Acute Institutional Stay (SKILLED_NURSING_FACILITY): Payer: Medicare Other | Admitting: Adult Health

## 2018-05-14 DIAGNOSIS — R600 Localized edema: Secondary | ICD-10-CM

## 2018-05-14 DIAGNOSIS — E039 Hypothyroidism, unspecified: Secondary | ICD-10-CM

## 2018-05-14 DIAGNOSIS — D62 Acute posthemorrhagic anemia: Secondary | ICD-10-CM

## 2018-05-14 DIAGNOSIS — K5793 Diverticulitis of intestine, part unspecified, without perforation or abscess with bleeding: Secondary | ICD-10-CM

## 2018-05-14 DIAGNOSIS — I471 Supraventricular tachycardia: Secondary | ICD-10-CM | POA: Diagnosis not present

## 2018-05-14 DIAGNOSIS — E782 Mixed hyperlipidemia: Secondary | ICD-10-CM

## 2018-05-14 LAB — TYPE AND SCREEN
ABO/RH(D): A POS
Antibody Screen: NEGATIVE
Unit division: 0
Unit division: 0
Unit division: 0
Unit division: 0

## 2018-05-14 LAB — BPAM RBC
BLOOD PRODUCT EXPIRATION DATE: 202003202359
Blood Product Expiration Date: 202003192359
Blood Product Expiration Date: 202003202359
Blood Product Expiration Date: 202003202359
ISSUE DATE / TIME: 202003030531
ISSUE DATE / TIME: 202003041231
ISSUE DATE / TIME: 202003050251
Unit Type and Rh: 6200
Unit Type and Rh: 6200
Unit Type and Rh: 6200
Unit Type and Rh: 6200

## 2018-05-14 NOTE — Progress Notes (Signed)
[facility]  [facility] Room Number: 350-P Place of Service:  SNF (31) Provider:  Durenda Age, NP  Patient Care Team: Rusty Aus, MD as PCP - General (Internal Medicine)  Extended Emergency Contact Information Primary Emergency Contact: [facility] Address: [address]          [place], [place] [address] Home Phone: [phone] Work Phone: [phone] Relation: Son Secondary Emergency Contact: Nakshatra, Klose Mobile Phone: [phone] Relation: Son  Code Status:  Full Code  Goals of care: Advanced Directive information Advanced Directives 05/12/2018  Does Patient Have a Medical Advance Directive? Yes  Type of Advance Directive -  Does patient want to make changes to medical advance directive? -  Copy of [place] in Chart? -  Would patient like information on creating a medical advance directive? -     Chief Complaint  Patient presents with  . Acute Visit    Hospital followup, status post admission at [facility] for a GI bleed    HPI:  Pt is a 85 y.o. female seen today for hospital followup.  She was admitted to [facility] on 05/13/18 for short-term rehabilitation following a hospitalization at [facility] 3/2-05/13/18 for an acute GI bleed.  She was recently hospitalized for SVT and was started on Eliquis.  Eliquis was discontinued.  EGD and colonoscopy done on 05/12/2018 showed multiple diverticuli possible source of bleed in the transverse colon.  No active bleeding was noted.  She was transfused 2 units of packed RBC.  Hemoglobin was more than 8 at time of discharge as recommended by cardiologist. 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    She was seen in the room today.  She verbalized that she takes Lasix PRN and not daily. She denies any pain nor having any blood stool.  Past Medical History:  Diagnosis Date  .  Acquired hypothyroidism 08/09/2015  . Aortic stenosis, moderate 02/07/2015   Stable 5/17, slightly worse 5/18  . Atrial fibrillation ([place])   . Benign essential hypertension 08/09/2015  . Chicken pox   . Colon polyps   . Hyperlipidemia, mixed 08/09/2015  . Medicare annual wellness visit, initial 02/11/2016   12/17,5/18, 6/19  12/17,5/18  . SCC (squamous cell carcinoma), face 06/19/2016   Past Surgical History:  Procedure Laterality Date  . APPENDECTOMY    . AXILLARY LYMPH NODE BIOPSY     needle biopsy   . CHOLECYSTECTOMY    . COLONOSCOPY Left 05/12/2018   Procedure: COLONOSCOPY;  Surgeon: Lin Landsman, MD;  Location: [facility] ENDOSCOPY;  Service: Gastroenterology;  Laterality: Left;  . ESOPHAGOGASTRODUODENOSCOPY Left 05/12/2018   Procedure: ESOPHAGOGASTRODUODENOSCOPY (EGD);  Surgeon: Lin Landsman, MD;  Location: [facility] ENDOSCOPY;  Service: Gastroenterology;  Laterality: Left;  . INSERT / REPLACE / REMOVE PACEMAKER    . MOHS SURGERY  07/2016   Dr. Manley Mason  . TONSILLECTOMY    . WISDOM TOOTH EXTRACTION      Allergies  Allergen Reactions  . Ace Inhibitors     Other reaction(s): Cough  . Fentanyl Nausea And Vomiting  . Morphine Other (See Comments)    Other reaction(s): Muscle Pain  . Alendronate     Muscle pain    Outpatient Encounter Medications as of 05/14/2018  Medication Sig  . albuterol (PROVENTIL HFA;VENTOLIN HFA) 108 (90 Base) MCG/ACT inhaler Inhale 2 puffs into the lungs every 6 (six) hours as needed for wheezing or  shortness of breath.  . clobetasol cream (TEMOVATE) 0.05 % APPLY TOPICALLY 3 TIMES A WEEK  . Diaper Rash Products (A+D DIAPER RASH) CREA Apply topically daily.   . ferrous gluconate (FERGON) 324 MG tablet Take 324 mg by mouth daily.  . furosemide (LASIX) 20 MG tablet Take 20 mg by mouth daily.  Marland Kitchen levothyroxine (SYNTHROID, LEVOTHROID) 50 MCG tablet TAKE ONE TABLET BY MOUTH DAILY  . metoprolol succinate (TOPROL XL) 25 MG 24 hr tablet Take 1 tablet (25 mg total)  by mouth daily.  . Multiple Vitamins-Minerals (MULTIVITAMIN ADULT) TABS Take 1 tablet by mouth daily.  . pantoprazole (PROTONIX) 40 MG tablet Take 1 tablet (40 mg total) by mouth daily.  . [DISCONTINUED] Vitamins A & D (VITAMIN A & D) ointment Apply 1 application topically daily.   No facility-administered encounter medications on file as of 05/14/2018.     Review of Systems  GENERAL: No change in appetite, no fatigue, no weight changes, no fever, chills or weakness MOUTH and THROAT: Denies oral discomfort, gingival pain or bleeding, pain from teeth RESPIRATORY: no cough, SOB, DOE, wheezing, hemoptysis CARDIAC: No chest pain, edema or palpitations GI: No abdominal pain, diarrhea, constipation, heart burn, nausea or vomiting GU: Denies dysuria, frequency, hematuria, incontinence, or discharge NEUROLOGICAL: Denies dizziness, syncope, numbness, or headache PSYCHIATRIC: Denies feelings of depression or anxiety. No report of hallucinations, insomnia, paranoia, or agitation   Immunization History  Administered Date(s) Administered  . Pneumococcal Conjugate-13 02/19/2016  . Pneumococcal Polysaccharide-23 07/18/2013  . Zoster 12/18/2010   Pertinent  Health Maintenance Due  Topic Date Due  . DEXA SCAN  08/02/1992  . INFLUENZA VACCINE  10/08/2017  . PNA vac Low Risk Adult  Completed    Vitals:   05/14/18 0951  BP: 128/65  Pulse: 66  Resp: 18  Temp: (!) 97.4 F (36.3 C)  TempSrc: Oral  SpO2: 97%  Weight: 169 lb 12.8 oz (77 kg)  Height: 5\' 6"  (1.676 m)   Body mass index is 27.41 kg/m.  Physical Exam  GENERAL APPEARANCE: Well nourished. In no acute distress. Normal body habitus SKIN:  Skin is warm and dry.  MOUTH and THROAT: Lips are without lesions. Oral mucosa is moist and without lesions. Tongue is normal in shape, size, and color and without lesions RESPIRATORY: Breathing is even & unlabored, BS CTAB CARDIAC: RRR, + murmur,no extra heart sounds, no edema GI: Abdomen soft,  normal BS, no masses, no tenderness EXTREMITIES:  Able to move X 4 extremities NEUROLOGICAL: There is no tremor. Speech is clear. Alert and oriented X 3. PSYCHIATRIC:  Affect and behavior are appropriate   Labs reviewed: Recent Labs    05/04/18 1203  05/11/18 0348 05/12/18 0141 05/13/18 0146  NA 139   < > 135 136 138  K 4.0   < > 4.5 3.7 3.9  CL 106   < > 106 110 110  CO2 24   < > 20* 20* 18*  GLUCOSE 99   < > 139* 116* 110*  BUN 22   < > 33* 31* 29*  CREATININE 1.08*   < > 1.47* 1.40* 1.25*  CALCIUM 9.2   < > 8.1* 8.0* 7.6*  MG 2.2  --   --   --   --    < > = values in this interval not displayed.    Recent Labs    05/10/18 1800  05/12/18 0141  05/12/18 1840 05/13/18 0146 05/13/18 0835 05/13/18 1321  WBC 9.4   < >  8.2  --  12.0* 11.1*  --   --   NEUTROABS 6.1  --   --   --   --   --   --   --   HGB 10.0*   < > 7.2*   < > 7.8* 6.9* 8.3* 8.7*  HCT 30.8*   < > 21.9*  --  23.1* 20.0*  --   --   MCV 97.8   < > 94.4  --  89.5 88.1  --   --   PLT 500*   < > 331  --  277 242  --   --    < > = values in this interval not displayed.   Lab Results  Component Value Date   TSH 3.037 05/04/2018     Significant Diagnostic Results in last 30 days:  Dg Chest 2 View  Result Date: 05/04/2018 CLINICAL DATA:  Supraventricular tachycardia. EXAM: CHEST - 2 VIEW COMPARISON:  12/10/2010, 03/27/2005. FINDINGS: Cardiac silhouette normal in size, unchanged. Prior TAVR. Thoracic aorta mildly atherosclerotic. Hilar and mediastinal contours otherwise unremarkable. Lungs clear. Bronchovascular markings normal. Pulmonary vascularity normal. No visible pleural effusions. No pneumothorax. Degenerative changes involving the visualized lumbar spine. IMPRESSION: No acute cardiopulmonary disease. Electronically Signed   By: Evangeline Dakin M.D.   On: 05/04/2018 13:03   Nm Gi Blood Loss  Result Date: 05/11/2018 CLINICAL DATA:  GI bleed, episodes of red blood coming from rectum beginning 24 hours ago,  decreased now, heart valve repair placed on Eliquis last week for possible clot EXAM: NUCLEAR MEDICINE GASTROINTESTINAL BLEEDING SCAN TECHNIQUE: Sequential abdominal images were obtained following intravenous administration of Tc-74m labeled red blood cells. RADIOPHARMACEUTICALS:  22.07 mCi Tc-76m pertechnetate in-vitro labeled red cells. COMPARISON:  None Correlation: None FINDINGS: Scattered patient motion throughout the 2 hours of imaging. Normal initial blood pool distribution of tracer. During the first hour imaging, no abnormal gastrointestinal localization of tracer is identified. Small amount of de labeled tracer is seen excreted into the urinary bladder. Second hour of imaging demonstrates initially normal blood pool distribution of tracer. During the second hour of imaging, abnormal gastrointestinal localization of tracer is seen at the upper mid abdomen, curvilinear, based on position question gastric versus distal transverse colonic source. IMPRESSION: Abnormal GI bleeding exam demonstrating delayed GI localization of tracer during the second hour of imaging, in a curvilinear fashion located at the upper mid abdomen and LEFT upper quadrant, question gastric versus transverse colonic source. Electronically Signed   By: Lavonia Dana M.D.   On: 05/11/2018 15:29    Assessment/Plan  1. Gastrointestinal hemorrhage associated with intestinal diverticulitis - Eliquis was discontinued.  EGD and colonoscopy done on 05/12/2018 showed multiple diverticuli possible source of bleed in the transverse colon.  No active bleeding was noted.  - will continue Protonix DR 40 mg 1 tablet daily  2. Anemia due to acute blood loss Lab Results  Component Value Date   HGB 8.7 (L) 05/13/2018  - was transfused 2 units of packed RBC.  Hemoglobin was more than 8 at time of discharge as recommended by cardiologist. - will continue ferrous gluconate 324 mg 1 tablet daily  3. SVT (supraventricular tachycardia)  (HCC) -Eliquis was discontinued, continue metoprolol succinate ER 25 mg 1 tab daily -Follow-up with cardiology  4. Acquired hypothyroidism Lab Results  Component Value Date   TSH 3.037 05/04/2018  - will continue Synthroid 50 mcg 1 tab daily  5. Edema of lower extremity -Will change Lasix 20 mg daily  as needed for weight gain > 3lbs  6.  Hyperlipidemia - will continue atorvastatin 10 mg 1 tab q. other day bedtime    Family/ staff Communication: Discussed plan of care with resident.  Labs/tests ordered: None  Goals of care:   Short-term rehabilitation.   Durenda Age, NP [facility] and Adult Medicine [phone] (Monday-Friday 8:00 a.m. - 5:00 p.m.) [phone] (after hours)

## 2018-05-17 ENCOUNTER — Encounter: Payer: Self-pay | Admitting: Adult Health

## 2018-05-17 ENCOUNTER — Non-Acute Institutional Stay (SKILLED_NURSING_FACILITY): Payer: Medicare Other | Admitting: Adult Health

## 2018-05-17 DIAGNOSIS — D62 Acute posthemorrhagic anemia: Secondary | ICD-10-CM

## 2018-05-17 DIAGNOSIS — K5793 Diverticulitis of intestine, part unspecified, without perforation or abscess with bleeding: Secondary | ICD-10-CM

## 2018-05-17 DIAGNOSIS — E782 Mixed hyperlipidemia: Secondary | ICD-10-CM

## 2018-05-17 DIAGNOSIS — E039 Hypothyroidism, unspecified: Secondary | ICD-10-CM

## 2018-05-17 DIAGNOSIS — I471 Supraventricular tachycardia: Secondary | ICD-10-CM

## 2018-05-17 DIAGNOSIS — R600 Localized edema: Secondary | ICD-10-CM

## 2018-05-17 NOTE — Progress Notes (Signed)
[facility] Room Number: 350-P Place of Service:  SNF (31) Provider:  Durenda Age, NP  Patient Care Team: Rusty Aus, MD as PCP - General (Internal Medicine)  Extended Emergency Contact Information Primary Emergency Contact: [facility] Address: [name]          [place], [place] [address] Home Phone: [phone] Work Phone: [phone] Relation: Son Secondary Emergency Contact: [name] Mobile Phone: [phone] Relation: Son  Code Status:  Full Code  Goals of care: Advanced Directive information Advanced Directives 05/12/2018  Does Patient Have a Medical Advance Directive? Yes  Type of Advance Directive -  Does patient want to make changes to medical advance directive? -  Copy of [place] in Chart? -  Would patient like information on creating a medical advance directive? -     Chief Complaint  Patient presents with  . Discharge Note    Patient to discharge back to her cottage at [place] on 05/18/18.    HPI:  Pt is a 85 y.o. [name] seen today for a discharge visit.  She is to transfer back to her cottage at [place] with home health OT and PT on 05/18/18.   She was admitted to [facility] on 05/13/2018 for short-term rehabilitation following a hospitalization at [facility] 3/2 -05/13/2018 for an acute GI bleed.  She was recently hospitalized for SVT and was a started on Eliquis.  Eliquis was discontinued.  EGD and colonoscopy done on 05/12/2018 which showed multiple diverticuli possible source of bleed in the transverse colon.  No active bleeding was noted. She was transfused blood. She has a PMH of a GI bleed, acquired hypothyroidism, aortic stenosis, atrial fibrillation, essential HTN, colon polyps, HLD, and squamous cell carcinoma of the face.    Past Medical History:  Diagnosis Date  . Acquired hypothyroidism 08/09/2015  . Aortic stenosis, moderate 02/07/2015   Stable 5/17, slightly worse 5/18  . Atrial fibrillation  ([place])   . Benign essential hypertension 08/09/2015  . Chicken pox   . Colon polyps   . Hyperlipidemia, mixed 08/09/2015  . Medicare annual wellness visit, initial 04/12/18, 08/11/16, 02/11/16  . SCC (squamous cell carcinoma), face 06/19/2016   Past Surgical History:  Procedure Laterality Date  . APPENDECTOMY    . AXILLARY LYMPH NODE BIOPSY     needle biopsy   . CHOLECYSTECTOMY    . COLONOSCOPY Left 05/12/2018   Procedure: COLONOSCOPY;  Surgeon: Lin Landsman, MD;  Location: [facility] ENDOSCOPY;  Service: Gastroenterology;  Laterality: Left;  . ESOPHAGOGASTRODUODENOSCOPY Left 05/12/2018   Procedure: ESOPHAGOGASTRODUODENOSCOPY (EGD);  Surgeon: Lin Landsman, MD;  Location: [facility] ENDOSCOPY;  Service: Gastroenterology;  Laterality: Left;  . INSERT / REPLACE / REMOVE PACEMAKER    . MOHS SURGERY  07/2016   Dr. Manley Mason  . TONSILLECTOMY    . WISDOM TOOTH EXTRACTION      Allergies  Allergen Reactions  . Ace Inhibitors     Other reaction(s): Cough  . Fentanyl Nausea And Vomiting  . Morphine Other (See Comments)    Other reaction(s): Muscle Pain  . Alendronate     Muscle pain    Outpatient Encounter Medications as of 05/17/2018  Medication Sig  . albuterol (PROVENTIL HFA;VENTOLIN HFA) 108 (90 Base) MCG/ACT inhaler Inhale 2 puffs into the lungs every 6 (six) hours as needed for wheezing or shortness of breath.  Marland Kitchen atorvastatin (LIPITOR) 10 MG tablet Take 10 mg by mouth every other day.  . clobetasol cream (TEMOVATE) 0.05 %  APPLY TOPICALLY 3 TIMES A WEEK  . Diaper Rash Products (A+D DIAPER RASH) CREA Apply topically daily.   . ferrous gluconate (FERGON) 324 MG tablet Take 324 mg by mouth daily.  . furosemide (LASIX) 20 MG tablet Take 20 mg by mouth daily as needed for fluid.   Marland Kitchen levothyroxine (SYNTHROID, LEVOTHROID) 50 MCG tablet TAKE ONE TABLET BY MOUTH DAILY  . metoprolol succinate (TOPROL XL) 25 MG 24 hr tablet Take 1 tablet (25 mg total) by mouth daily.  . Multiple Vitamins-Minerals  (MULTIVITAMIN ADULT) TABS Take 1 tablet by mouth daily.  . pantoprazole (PROTONIX) 40 MG tablet Take 1 tablet (40 mg total) by mouth daily.   No facility-administered encounter medications on file as of 05/17/2018.     Review of Systems  GENERAL: No change in appetite, no fatigue, no weight changes, no fever, chills or weakness MOUTH and THROAT: Denies oral discomfort, gingival pain or bleeding RESPIRATORY: no cough, SOB, DOE, wheezing, hemoptysis CARDIAC: No chest pain, edema or palpitations GI: No abdominal pain, diarrhea, constipation, heart burn, nausea or vomiting GU: Denies dysuria, frequency, hematuria, incontinence, or discharge NEUROLOGICAL: Denies dizziness, syncope, numbness, or headache PSYCHIATRIC: Denies feelings of depression or anxiety. No report of hallucinations, insomnia, paranoia, or agitation    Immunization History  Administered Date(s) Administered  . Pneumococcal Conjugate-[place] 02/19/2016  . Pneumococcal Polysaccharide-23 07/18/2013  . Zoster 12/18/2010   Pertinent  Health Maintenance Due  Topic Date Due  . DEXA SCAN  08/02/1992  . INFLUENZA VACCINE  10/08/2017  . PNA vac Low Risk Adult  Completed    Vitals:   05/17/18 1400  BP: 134/66  Pulse: 70  Resp: 20  Temp: 97.7 F (36.5 C)  TempSrc: Oral  SpO2: 93%  Weight: 169 lb 12.8 oz (77 kg)  Height: 5\' 6"  (1.676 m)   Body mass index is 27.41 kg/m.  Physical Exam  GENERAL APPEARANCE: Well nourished. In no acute distress. Normal body habitus SKIN:  Skin is warm and dry. MOUTH and THROAT: Lips are without lesions. Oral mucosa is moist and without lesions. Tongue is normal in shape, size, and color and without lesions RESPIRATORY: Breathing is even & unlabored, BS CTAB CARDIAC: RRR, + murmur,no extra heart sounds, no edema, Left chest pacemaker GI: Abdomen soft, normal BS, no masses, no tenderness EXTREMITIES:  Able to move X 4 extremities NEUROLOGICAL: There is no tremor. Speech is clear. Alert and  oriented X 3. PSYCHIATRIC:  Affect and behavior are appropriate   Labs reviewed: Recent Labs    05/04/18 1203  05/11/18 0348 05/12/18 0141 05/13/18 0146  NA 139   < > 135 136 138  K 4.0   < > 4.5 3.7 3.9  CL 106   < > 106 110 110  CO2 24   < > 20* 20* 18*  GLUCOSE 99   < > 139* 116* 110*  BUN 22   < > 33* 31* 29*  CREATININE 1.08*   < > 1.47* 1.40* 1.25*  CALCIUM 9.2   < > 8.1* 8.0* 7.6*  MG 2.2  --   --   --   --    < > = values in this interval not displayed.    Recent Labs    05/10/18 1800  05/12/18 0141  05/12/18 1840 05/13/18 0146 05/13/18 0835 05/13/18 1321  WBC 9.4   < > 8.2  --  12.0* 11.1*  --   --   NEUTROABS 6.1  --   --   --   --   --   --   --  HGB 10.0*   < > 7.2*   < > 7.8* 6.9* 8.3* 8.7*  HCT 30.8*   < > 21.9*  --  23.1* 20.0*  --   --   MCV 97.8   < > 94.4  --  89.5 88.1  --   --   PLT 500*   < > 331  --  277 242  --   --    < > = values in this interval not displayed.   Lab Results  Component Value Date   TSH 3.037 05/04/2018    Significant Diagnostic Results in last 30 days:  Dg Chest 2 View  Result Date: 05/04/2018 CLINICAL DATA:  Supraventricular tachycardia. EXAM: CHEST - 2 VIEW COMPARISON:  12/10/2010, 03/27/2005. FINDINGS: Cardiac silhouette normal in size, unchanged. Prior TAVR. Thoracic aorta mildly atherosclerotic. Hilar and mediastinal contours otherwise unremarkable. Lungs clear. Bronchovascular markings normal. Pulmonary vascularity normal. No visible pleural effusions. No pneumothorax. Degenerative changes involving the visualized lumbar spine. IMPRESSION: No acute cardiopulmonary disease. Electronically Signed   By: Evangeline Dakin M.D.   On: 05/04/2018 [place]:03   Nm Gi Blood Loss  Result Date: 05/11/2018 CLINICAL DATA:  GI bleed, episodes of red blood coming from rectum beginning 24 hours ago, decreased now, heart valve repair placed on Eliquis last week for possible clot EXAM: NUCLEAR MEDICINE GASTROINTESTINAL BLEEDING SCAN TECHNIQUE:  Sequential abdominal images were obtained following intravenous administration of Tc-65m labeled red blood cells. RADIOPHARMACEUTICALS:  22.07 mCi Tc-75m pertechnetate in-vitro labeled red cells. COMPARISON:  None Correlation: None FINDINGS: Scattered patient motion throughout the 2 hours of imaging. Normal initial blood pool distribution of tracer. During the first hour imaging, no abnormal gastrointestinal localization of tracer is identified. Small amount of de labeled tracer is seen excreted into the urinary bladder. Second hour of imaging demonstrates initially normal blood pool distribution of tracer. During the second hour of imaging, abnormal gastrointestinal localization of tracer is seen at the upper mid abdomen, curvilinear, based on position question gastric versus distal transverse colonic source. IMPRESSION: Abnormal GI bleeding exam demonstrating delayed GI localization of tracer during the second hour of imaging, in a curvilinear fashion located at the upper mid abdomen and LEFT upper quadrant, question gastric versus transverse colonic source. Electronically Signed   By: Lavonia Dana M.D.   On: 05/11/2018 15:29    Assessment/Plan  1. Gastrointestinal hemorrhage associated with intestinal diverticulitis -Eliquis was discontinued, EGD and colonoscopy done on 05/12/2018 showed multiple diverticuli possible source of bleedMDS.  In the transverse colon, no active bleeding was noted -Continue Protonix EC 40 mg 1 tab daily, follow-up with GI  2. SVT (supraventricular tachycardia) (HCC) -Rate controlled, continue metoprolol succinate 25 mg 1 tab daily, follow-up with cardiology, Eliquis was discontinued, follow -up with cardiology  3. Acquired hypothyroidism Lab Results  Component Value Date   TSH 3.037 05/04/2018  -Continue Synthroid 50 mcg 1 tab daily   4. Hyperlipidemia, mixed -Continue atorvastatin 10 mg 1 tab q. other day at bedtime  5. Anemia due to acute blood loss Lab Results    Component Value Date   HGB 8.7 (L) 05/13/2018  - S/P blood transfusion in the hospital, continue ferrous sulfate 324 mg 1 tab daily   6. Lower extremity edema -Continue Lasix 20 mg 1 tab daily as needed for weight gain = >3 lbls    I have filled out patient's discharge paperwork and written prescriptions.  Patient will receive home health PT and OT.  DME provided:  None  Total discharge time: Greater than 30 minutes Greater than 50% was spent in counseling and coordination of care.  Discharge time involved coordination of the discharge process with social worker, nursing staff and therapy department. Medical justification for home health services verified.   Durenda Age, NP [facility] and Adult Medicine [phone] (Monday-Friday 8:00 a.m. - 5:00 p.m.) [phone] (after hours)

## 2018-05-18 ENCOUNTER — Other Ambulatory Visit
Admission: RE | Admit: 2018-05-18 | Discharge: 2018-05-18 | Disposition: A | Discharge status: Home / Self Care | Payer: Medicare Other | Source: Ambulatory Visit | Attending: Internal Medicine | Admitting: Internal Medicine

## 2018-05-18 DIAGNOSIS — K922 Gastrointestinal hemorrhage, unspecified: Secondary | ICD-10-CM | POA: Insufficient documentation

## 2018-05-18 DIAGNOSIS — D649 Anemia, unspecified: Secondary | ICD-10-CM | POA: Diagnosis present

## 2018-05-18 LAB — CBC WITH DIFFERENTIAL/PLATELET
Abs Immature Granulocytes: 0.07 10*3/uL (ref 0.00–0.07)
Basophils Absolute: 0 10*3/uL (ref 0.0–0.1)
Basophils Relative: 1 %
Eosinophils Absolute: 0.2 10*3/uL (ref 0.0–0.5)
Eosinophils Relative: 3 %
HCT: 27.6 % — ABNORMAL LOW (ref 36.0–46.0)
Hemoglobin: 8.7 g/dL — ABNORMAL LOW (ref 12.0–15.0)
Immature Granulocytes: 1 %
Lymphocytes Relative: 24 %
Lymphs Abs: 2.1 10*3/uL (ref 0.7–4.0)
MCH: 30.6 pg (ref 26.0–34.0)
MCHC: 31.5 g/dL (ref 30.0–36.0)
MCV: 97.2 fL (ref 80.0–100.0)
Monocytes Absolute: 0.7 10*3/uL (ref 0.1–1.0)
Monocytes Relative: 8 %
Neutro Abs: 5.7 10*3/uL (ref 1.7–7.7)
Neutrophils Relative %: 63 %
Platelets: 441 10*3/uL — ABNORMAL HIGH (ref 150–400)
RBC: 2.84 MIL/uL — ABNORMAL LOW (ref 3.87–5.11)
RDW: 17.6 % — ABNORMAL HIGH (ref 11.5–15.5)
WBC: 8.8 10*3/uL (ref 4.0–10.5)
nRBC: 0.3 % — ABNORMAL HIGH (ref 0.0–0.2)

## 2018-05-25 ENCOUNTER — Ambulatory Visit: Payer: Medicare Other | Admitting: Gastroenterology

## 2019-05-03 ENCOUNTER — Other Ambulatory Visit (HOSPITAL_COMMUNITY): Payer: Self-pay | Admitting: Neurology

## 2019-05-03 DIAGNOSIS — R259 Unspecified abnormal involuntary movements: Secondary | ICD-10-CM

## 2019-05-17 ENCOUNTER — Ambulatory Visit (HOSPITAL_COMMUNITY)
Admission: RE | Admit: 2019-05-17 | Discharge: 2019-05-17 | Disposition: A | Discharge status: Home / Self Care | Payer: Medicare PPO | Source: Ambulatory Visit | Attending: Neurology | Admitting: Neurology

## 2019-05-17 ENCOUNTER — Other Ambulatory Visit: Payer: Self-pay

## 2019-05-17 DIAGNOSIS — R259 Unspecified abnormal involuntary movements: Secondary | ICD-10-CM | POA: Diagnosis not present

## 2019-05-17 NOTE — Progress Notes (Signed)
Informed of MRI for today.   Device system confirmed to be MRI conditional, with implant date > 6 weeks ago and no evidence of abandoned or epicardial leads in review of most recent CXR Interrogation from today reviewed, pt is currently AS-VS at 83 bpm Change device settings for MRI to DOO at 90 bpm  Tachy-therapies to off if applicable.  Program device back to pre-MRI settings after completion of exam.  Annamaria Helling  05/17/2019 12:57 PM

## 2019-05-18 ENCOUNTER — Other Ambulatory Visit: Payer: Self-pay

## 2019-05-18 ENCOUNTER — Encounter: Payer: Self-pay | Admitting: Ophthalmology

## 2019-05-18 ENCOUNTER — Encounter: Payer: Self-pay | Admitting: Anesthesiology

## 2019-05-20 ENCOUNTER — Other Ambulatory Visit
Admission: RE | Admit: 2019-05-20 | Discharge: 2019-05-20 | Disposition: A | Discharge status: Home / Self Care | Payer: Medicare PPO | Source: Ambulatory Visit | Attending: Ophthalmology | Admitting: Ophthalmology

## 2019-05-20 ENCOUNTER — Other Ambulatory Visit: Payer: Self-pay

## 2019-05-20 DIAGNOSIS — Z01812 Encounter for preprocedural laboratory examination: Secondary | ICD-10-CM | POA: Diagnosis present

## 2019-05-20 DIAGNOSIS — Z20822 Contact with and (suspected) exposure to covid-19: Secondary | ICD-10-CM | POA: Insufficient documentation

## 2019-05-20 NOTE — Discharge Instructions (Signed)

## 2019-05-21 LAB — SARS CORONAVIRUS 2 (TAT 6-24 HRS): SARS Coronavirus 2: NEGATIVE

## 2019-05-24 ENCOUNTER — Ambulatory Visit: Admission: RE | Admit: 2019-05-24 | Payer: Medicare PPO | Source: Home / Self Care | Admitting: Ophthalmology

## 2019-05-24 HISTORY — DX: Parkinson's disease: G20

## 2019-05-24 HISTORY — DX: Other complications of anesthesia, initial encounter: T88.59XA

## 2019-05-24 HISTORY — DX: Presence of external hearing-aid: Z97.4

## 2019-05-24 HISTORY — DX: Parkinson's disease without dyskinesia, without mention of fluctuations: G20.A1

## 2019-05-24 HISTORY — DX: Gastro-esophageal reflux disease without esophagitis: K21.9

## 2019-05-24 SURGERY — PHACOEMULSIFICATION, CATARACT, WITH IOL INSERTION
Anesthesia: Topical | Laterality: Right

## 2019-07-08 ENCOUNTER — Other Ambulatory Visit: Payer: Self-pay

## 2019-07-08 ENCOUNTER — Emergency Department: Payer: Medicare PPO

## 2019-07-08 ENCOUNTER — Encounter: Payer: Self-pay | Admitting: Emergency Medicine

## 2019-07-08 ENCOUNTER — Inpatient Hospital Stay
Admission: EM | Admit: 2019-07-08 | Discharge: 2019-07-12 | DRG: 689 | Disposition: A | Discharge status: Hospice | Payer: Medicare PPO | Attending: Internal Medicine | Admitting: Internal Medicine

## 2019-07-08 DIAGNOSIS — N3000 Acute cystitis without hematuria: Principal | ICD-10-CM | POA: Diagnosis present

## 2019-07-08 DIAGNOSIS — I10 Essential (primary) hypertension: Secondary | ICD-10-CM | POA: Diagnosis present

## 2019-07-08 DIAGNOSIS — R29716 NIHSS score 16: Secondary | ICD-10-CM | POA: Diagnosis present

## 2019-07-08 DIAGNOSIS — B9620 Unspecified Escherichia coli [E. coli] as the cause of diseases classified elsewhere: Secondary | ICD-10-CM | POA: Diagnosis present

## 2019-07-08 DIAGNOSIS — R443 Hallucinations, unspecified: Secondary | ICD-10-CM | POA: Diagnosis present

## 2019-07-08 DIAGNOSIS — I5032 Chronic diastolic (congestive) heart failure: Secondary | ICD-10-CM | POA: Diagnosis present

## 2019-07-08 DIAGNOSIS — R471 Dysarthria and anarthria: Secondary | ICD-10-CM | POA: Diagnosis present

## 2019-07-08 DIAGNOSIS — Z8042 Family history of malignant neoplasm of prostate: Secondary | ICD-10-CM

## 2019-07-08 DIAGNOSIS — Z974 Presence of external hearing-aid: Secondary | ICD-10-CM | POA: Diagnosis not present

## 2019-07-08 DIAGNOSIS — E782 Mixed hyperlipidemia: Secondary | ICD-10-CM | POA: Diagnosis present

## 2019-07-08 DIAGNOSIS — G20A1 Parkinson's disease without dyskinesia, without mention of fluctuations: Secondary | ICD-10-CM

## 2019-07-08 DIAGNOSIS — Z20822 Contact with and (suspected) exposure to covid-19: Secondary | ICD-10-CM | POA: Diagnosis present

## 2019-07-08 DIAGNOSIS — Z8719 Personal history of other diseases of the digestive system: Secondary | ICD-10-CM

## 2019-07-08 DIAGNOSIS — Z8249 Family history of ischemic heart disease and other diseases of the circulatory system: Secondary | ICD-10-CM

## 2019-07-08 DIAGNOSIS — I110 Hypertensive heart disease with heart failure: Secondary | ICD-10-CM | POA: Diagnosis present

## 2019-07-08 DIAGNOSIS — Z950 Presence of cardiac pacemaker: Secondary | ICD-10-CM | POA: Diagnosis not present

## 2019-07-08 DIAGNOSIS — K219 Gastro-esophageal reflux disease without esophagitis: Secondary | ICD-10-CM | POA: Diagnosis present

## 2019-07-08 DIAGNOSIS — R410 Disorientation, unspecified: Secondary | ICD-10-CM | POA: Diagnosis not present

## 2019-07-08 DIAGNOSIS — Z952 Presence of prosthetic heart valve: Secondary | ICD-10-CM

## 2019-07-08 DIAGNOSIS — I442 Atrioventricular block, complete: Secondary | ICD-10-CM

## 2019-07-08 DIAGNOSIS — R591 Generalized enlarged lymph nodes: Secondary | ICD-10-CM | POA: Diagnosis present

## 2019-07-08 DIAGNOSIS — R4701 Aphasia: Secondary | ICD-10-CM | POA: Diagnosis present

## 2019-07-08 DIAGNOSIS — Z888 Allergy status to other drugs, medicaments and biological substances status: Secondary | ICD-10-CM | POA: Diagnosis not present

## 2019-07-08 DIAGNOSIS — R531 Weakness: Secondary | ICD-10-CM | POA: Diagnosis present

## 2019-07-08 DIAGNOSIS — Z885 Allergy status to narcotic agent status: Secondary | ICD-10-CM

## 2019-07-08 DIAGNOSIS — I350 Nonrheumatic aortic (valve) stenosis: Secondary | ICD-10-CM | POA: Diagnosis present

## 2019-07-08 DIAGNOSIS — R4182 Altered mental status, unspecified: Secondary | ICD-10-CM

## 2019-07-08 DIAGNOSIS — E785 Hyperlipidemia, unspecified: Secondary | ICD-10-CM | POA: Diagnosis not present

## 2019-07-08 DIAGNOSIS — G20 Parkinson's disease: Secondary | ICD-10-CM | POA: Diagnosis present

## 2019-07-08 DIAGNOSIS — E039 Hypothyroidism, unspecified: Secondary | ICD-10-CM | POA: Diagnosis present

## 2019-07-08 DIAGNOSIS — Z79890 Hormone replacement therapy (postmenopausal): Secondary | ICD-10-CM | POA: Diagnosis not present

## 2019-07-08 DIAGNOSIS — Z79899 Other long term (current) drug therapy: Secondary | ICD-10-CM

## 2019-07-08 DIAGNOSIS — Z66 Do not resuscitate: Secondary | ICD-10-CM | POA: Diagnosis present

## 2019-07-08 DIAGNOSIS — J9859 Other diseases of mediastinum, not elsewhere classified: Secondary | ICD-10-CM | POA: Diagnosis not present

## 2019-07-08 DIAGNOSIS — Z85828 Personal history of other malignant neoplasm of skin: Secondary | ICD-10-CM

## 2019-07-08 DIAGNOSIS — R270 Ataxia, unspecified: Secondary | ICD-10-CM | POA: Diagnosis present

## 2019-07-08 DIAGNOSIS — I480 Paroxysmal atrial fibrillation: Secondary | ICD-10-CM | POA: Diagnosis present

## 2019-07-08 DIAGNOSIS — N390 Urinary tract infection, site not specified: Secondary | ICD-10-CM

## 2019-07-08 DIAGNOSIS — G9341 Metabolic encephalopathy: Secondary | ICD-10-CM | POA: Diagnosis present

## 2019-07-08 LAB — URINALYSIS, ROUTINE W REFLEX MICROSCOPIC
Bilirubin Urine: NEGATIVE
Glucose, UA: NEGATIVE mg/dL
Hgb urine dipstick: NEGATIVE
Ketones, ur: NEGATIVE mg/dL
Nitrite: POSITIVE — AB
Protein, ur: NEGATIVE mg/dL
Specific Gravity, Urine: 1.015 (ref 1.005–1.030)
WBC, UA: >50 WBC/hpf — ABNORMAL HIGH (ref 0–5)
pH: 5 (ref 5.0–8.0)

## 2019-07-08 LAB — COMPREHENSIVE METABOLIC PANEL
ALT: 6 U/L (ref 0–44)
AST: 31 U/L (ref 15–41)
Albumin: 4.1 g/dL (ref 3.5–5.0)
Alkaline Phosphatase: 147 U/L — ABNORMAL HIGH (ref 38–126)
Anion gap: 11 (ref 5–15)
BUN: 18 mg/dL (ref 8–23)
CO2: 24 mmol/L (ref 22–32)
Calcium: 9.8 mg/dL (ref 8.9–10.3)
Chloride: 100 mmol/L (ref 98–111)
Creatinine, Ser: 0.92 mg/dL (ref 0.44–1.00)
GFR calc Af Amer: >60 mL/min (ref >60)
GFR calc non Af Amer: 54 mL/min — ABNORMAL LOW (ref >60)
Glucose, Bld: 106 mg/dL — ABNORMAL HIGH (ref 70–99)
Potassium: 4 mmol/L (ref 3.5–5.1)
Sodium: 135 mmol/L (ref 135–145)
Total Bilirubin: 1.1 mg/dL (ref 0.3–1.2)
Total Protein: 6.5 g/dL (ref 6.5–8.1)

## 2019-07-08 LAB — CBC
HCT: 31.3 % — ABNORMAL LOW (ref 36.0–46.0)
Hemoglobin: 10.6 g/dL — ABNORMAL LOW (ref 12.0–15.0)
MCH: 33 pg (ref 26.0–34.0)
MCHC: 33.9 g/dL (ref 30.0–36.0)
MCV: 97.5 fL (ref 80.0–100.0)
Platelets: 504 10*3/uL — ABNORMAL HIGH (ref 150–400)
RBC: 3.21 MIL/uL — ABNORMAL LOW (ref 3.87–5.11)
RDW: 13.9 % (ref 11.5–15.5)
WBC: 7.4 10*3/uL (ref 4.0–10.5)
nRBC: 0 % (ref 0.0–0.2)

## 2019-07-08 LAB — URINE DRUG SCREEN, QUALITATIVE (ARMC ONLY)
Amphetamines, Ur Screen: NOT DETECTED
Barbiturates, Ur Screen: NOT DETECTED
Benzodiazepine, Ur Scrn: NOT DETECTED
Cannabinoid 50 Ng, Ur ~~LOC~~: NOT DETECTED
Cocaine Metabolite,Ur ~~LOC~~: NOT DETECTED
MDMA (Ecstasy)Ur Screen: NOT DETECTED
Methadone Scn, Ur: NOT DETECTED
Opiate, Ur Screen: NOT DETECTED
Phencyclidine (PCP) Ur S: NOT DETECTED
Tricyclic, Ur Screen: NOT DETECTED

## 2019-07-08 LAB — ETHANOL: Alcohol, Ethyl (B): <10 mg/dL (ref <10)

## 2019-07-08 LAB — PROTIME-INR
INR: 1 (ref 0.8–1.2)
Prothrombin Time: 12.6 seconds (ref 11.4–15.2)

## 2019-07-08 LAB — APTT: aPTT: 28 seconds (ref 24–36)

## 2019-07-08 MED ORDER — SODIUM CHLORIDE 0.9% FLUSH
3.0000 mL | Freq: Once | INTRAVENOUS | Status: AC
  Administered 2019-07-08: 21:00:00 3 mL via INTRAVENOUS

## 2019-07-08 MED ORDER — HALOPERIDOL LACTATE 5 MG/ML IJ SOLN
3.0000 mg | Freq: Once | INTRAMUSCULAR | Status: AC
  Administered 2019-07-08: 3 mg via INTRAVENOUS

## 2019-07-08 MED ORDER — SODIUM CHLORIDE 0.9 % IV SOLN
1.0000 g | Freq: Once | INTRAVENOUS | Status: AC
  Administered 2019-07-08: 23:00:00 1 g via INTRAVENOUS
  Filled 2019-07-08: qty 10

## 2019-07-08 MED ORDER — HALOPERIDOL LACTATE 5 MG/ML IJ SOLN
2.0000 mg | Freq: Once | INTRAMUSCULAR | Status: AC
  Administered 2019-07-08: 21:00:00 2 mg via INTRAVENOUS
  Filled 2019-07-08: qty 1

## 2019-07-08 MED ORDER — IOHEXOL 350 MG/ML SOLN
100.0000 mL | Freq: Once | INTRAVENOUS | Status: AC | PRN
  Administered 2019-07-08: 100 mL via INTRAVENOUS

## 2019-07-08 MED ORDER — ZIPRASIDONE MESYLATE 20 MG IM SOLR
20.0000 mg | Freq: Once | INTRAMUSCULAR | Status: AC
  Administered 2019-07-08: 22:00:00 20 mg via INTRAMUSCULAR
  Filled 2019-07-08: qty 20

## 2019-07-08 NOTE — Progress Notes (Signed)
Chaplain responded to code stroke in ED-1. Chaplain met patient and patient's son. Son advised he thought mother had a stroke. Chaplain offered prayer for patient emotional support for son. Chaplain charged patient son cell phone and returned it.

## 2019-07-08 NOTE — ED Notes (Signed)
Jalesia Loudenslager [phone] Pt's son

## 2019-07-08 NOTE — ED Notes (Signed)
Pt's son at bedside

## 2019-07-08 NOTE — H&P (Signed)
History and Physical    ANYELIN MOGLE [name]:[id number] DOB: [birth year] [name]: 07/08/2019  PCP: Rusty Aus, MD  Patient coming from: home via ems   Chief Complaint: delirium  HPI: Olivia [name] is a 86 y.o. female with medical history significant for hypothyroidism, atrial stenosis s/p tavr in [id number] complicated by complete heart block with pacemaker placed, GI bleed, paroxysmal a fib in the setting of a UTI, dCHF with normal EF in 2020, and GI bleed, who presents with above.  Patient is described by her son as relatively high functioning. Ambulatory and conversant with a pretty good memory and so forth. Beginning late last year developed some slurred speech, tremor, and difficulty ambulating. Diagnosed with parkinsonism and started on sinemet. Earlier this week developed some worsening problem with memory, and then yesterday more trouble walking and speaking. Son came to visit her this afternoon and noticed this. Then around 5 PM things changed abruptly and she became "almost catatonic," not responding, not making sense when she spoke, unable to move.   ED Course: code stroke. CT head, labs, tele neuro consult. Haldol and geodon to sedate for CTA ordered by neuro.  Review of Systems: As per HPI otherwise 10 point review of systems negative.    Past Medical History:  Diagnosis Date  . Acquired hypothyroidism 08/09/2015  . Aortic stenosis, moderate 02/07/2015   Stable 5/17, slightly worse 5/18  . 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 Atrial fibrillation ([place])   . Benign essential hypertension 08/09/2015  . Chicken pox   . Colon polyps   . Complication of anesthesia    Fentanyl causes nausea and vomiting  . GERD (gastroesophageal reflux disease)   . H/O: GI bleed 05/2018  . Hyperlipidemia, mixed 08/09/2015  . Medicare annual wellness visit, initial 04/12/18, 08/11/16, 02/11/16  . Parkinson's disease ([place])   . Presence of permanent cardiac pacemaker 11/03/2017   [facility].  Medtronic Azure  XT Dr [id number]  . SCC (squamous cell carcinoma), face 06/19/2016  . Wears hearing aid in both ears     Past Surgical History:  Procedure Laterality Date  . AORTIC VALVE REPLACEMENT  10/2017   [facility]  . APPENDECTOMY    . AXILLARY LYMPH NODE BIOPSY     needle biopsy   . CHOLECYSTECTOMY    . COLONOSCOPY Left 05/12/2018   Procedure: COLONOSCOPY;  Surgeon: Lin Landsman, MD;  Location: [facility] ENDOSCOPY;  Service: Gastroenterology;  Laterality: Left;  . ESOPHAGOGASTRODUODENOSCOPY Left 05/12/2018   Procedure: ESOPHAGOGASTRODUODENOSCOPY (EGD);  Surgeon: Lin Landsman, MD;  Location: [facility] ENDOSCOPY;  Service: Gastroenterology;  Laterality: Left;  . INSERT / REPLACE / REMOVE PACEMAKER    . MOHS SURGERY  07/2016   Dr. Manley Mason  . TONSILLECTOMY    . WISDOM TOOTH EXTRACTION       reports that she has never smoked. She has never used smokeless tobacco. She reports previous alcohol use. She reports that she does not use drugs.  Allergies  Allergen Reactions  . Ace Inhibitors     Other reaction(s): Cough  . Fentanyl Nausea And Vomiting  . Morphine Other (See Comments)    Other reaction(s): Muscle Pain  . Alendronate     Muscle pain    Family History  Problem Relation Age of Onset  . Hypertension Mother   . Prostate cancer Father   . Healthy Sister   . Aortic stenosis Brother     Prior to Admission medications   Medication Sig Start  Date End Date Taking? Authorizing Provider  acetaminophen (TYLENOL) 500 MG tablet Take 1,000 mg by mouth 2 (two) times daily.   Yes [provider]  albuterol (PROVENTIL HFA;VENTOLIN HFA) 108 (90 Base) MCG/ACT inhaler Inhale 2 puffs into the lungs every 6 (six) hours as needed for wheezing or shortness of breath.   Yes [provider]  atorvastatin (LIPITOR) 20 MG tablet Take 20 mg by mouth daily.    Yes [provider]  carbidopa-levodopa (SINEMET IR) 25-100 MG tablet Take 2.5 tablets by mouth 3 (three) times daily.    Yes  [provider]  Cranberry 450 MG TABS Take 450 mg by mouth daily.    Yes [provider]  Diaper Rash Products (A+D DIAPER RASH) CREA Apply 1 application topically as directed.    Yes [provider]  ferrous gluconate (FERGON) 324 MG tablet Take 324 mg by mouth daily.   Yes [provider]  furosemide (LASIX) 20 MG tablet Take 20 mg by mouth daily as needed for fluid.  05/10/18  Yes [provider]  levothyroxine (SYNTHROID, LEVOTHROID) 50 MCG tablet Take 50 mcg by mouth daily.    Yes [provider]  metoprolol succinate (TOPROL-XL) 25 MG 24 hr tablet Take 25 mg by mouth daily.   Yes [provider]  Multiple Vitamins-Minerals (MULTIVITAMIN ADULT) TABS Take 1 tablet by mouth daily.   Yes [provider]  pantoprazole (PROTONIX) 40 MG tablet Take 40 mg by mouth daily.   Yes [provider]    Physical Exam: Vitals:   07/08/19 1929 07/08/19 1934 07/08/19 2234  BP: (!) 158/65  (!) 188/91  Pulse: 88  94  Resp: 18  20  Temp: 97.6 F (36.4 C)  98.1 F (36.7 C)  TempSrc: Oral  Axillary  SpO2: 96%  97%  Weight:  71 kg   Height:  5\' 6"  (1.676 m)     Constitutional: no acute distress. confused Head: Atraumatic Eyes: Conjunctiva clear ENM: Moist mucous membranes. Normal dentition.  Neck: Supple Respiratory: Clear to auscultation bilaterally, no wheezing/rales/rhonchi. Normal respiratory effort. No accessory muscle use. . Cardiovascular: Regular rate and rhythm. Mild systolic murmur Abdomen: Non-tender, non-distended. No masses. No rebound or guarding. Positive bowel sounds. Musculoskeletal: No joint deformity upper and lower extremities. Normal ROM, no contractures. Decreased muscle tone.  Skin: No rashes, lesions, or ulcers. Sacrum w/o erythema or ulceration  Extremities: trace LE peripheral edema. Palpable peripheral pulses. Neurologic: awake, smiles when spoken to but doesn't respond, follows a few simple  commands Psychiatric: appears confused. Not agitated   Labs on Admission: I have personally reviewed following labs and imaging studies  CBC: Recent Labs  Lab 07/08/19 1942  WBC 7.4  HGB 10.6*  HCT 31.3*  MCV 97.5  PLT 732*   Basic Metabolic Panel: Recent Labs  Lab 07/08/19 1942  NA 135  K 4.0  CL 100  CO2 24  GLUCOSE 106*  BUN 18  CREATININE 0.92  CALCIUM 9.8   GFR: Estimated Creatinine Clearance: 37.3 mL/min (by C-G formula based on SCr of 0.92 mg/dL). Liver Function Tests: Recent Labs  Lab 07/08/19 1942  AST 31  ALT 6  ALKPHOS 147*  BILITOT 1.1  PROT 6.5  ALBUMIN 4.1   No results for input(s): LIPASE, AMYLASE in the last 168 hours. No results for input(s): AMMONIA in the last 168 hours. Coagulation Profile: Recent Labs  Lab 07/08/19 1942  INR 1.0   Cardiac Enzymes: No results for input(s): CKTOTAL,  CKMB, CKMBINDEX, TROPONINI in the last 168 hours. BNP (last 3 results) No results for input(s): PROBNP in the last 8760 hours. HbA1C: No results for input(s): HGBA1C in the last 72 hours. CBG: No results for input(s): GLUCAP in the last 168 hours. Lipid Profile: No results for input(s): CHOL, HDL, LDLCALC, TRIG, CHOLHDL, LDLDIRECT in the last 72 hours. Thyroid Function Tests: No results for input(s): TSH, T4TOTAL, FREET4, T3FREE, THYROIDAB in the last 72 hours. Anemia Panel: No results for input(s): VITAMINB12, FOLATE, FERRITIN, TIBC, IRON, RETICCTPCT in the last 72 hours. Urine analysis:    Component Value Date/Time   COLORURINE YELLOW (A) 07/08/2019 2102   APPEARANCEUR CLOUDY (A) 07/08/2019 2102   LABSPEC 1.015 07/08/2019 2102   PHURINE 5.0 07/08/2019 2102   GLUCOSEU NEGATIVE 07/08/2019 2102   HGBUR NEGATIVE 07/08/2019 2102   BILIRUBINUR NEGATIVE 07/08/2019 2102   KETONESUR NEGATIVE 07/08/2019 2102   PROTEINUR NEGATIVE 07/08/2019 2102   NITRITE POSITIVE (A) 07/08/2019 2102   LEUKOCYTESUR LARGE (A) 07/08/2019 2102    Radiological Exams on  Admission: CT HEAD CODE STROKE WO CONTRAST`  Result Date: 07/08/2019 CLINICAL DATA:  Code stroke.  Altered mental status EXAM: CT HEAD WITHOUT CONTRAST TECHNIQUE: Contiguous axial images were obtained from the base of the skull through the vertex without intravenous contrast. COMPARISON:  CT head 12/10/2010 FINDINGS: Brain: Moderate atrophy, with progression in degree. Negative for hydrocephalus. Mild white matter changes compatible with chronic microvascular ischemia. Negative for acute infarct, hemorrhage, mass. Vascular: Negative for hyperdense vessel Skull: Negative Sinuses/Orbits: Paranasal sinuses clear. Right mastoid effusion. Left mastoid clear. Negative orbit. Other: None ASPECTS ([place] Stroke Program Early CT Score) - Ganglionic level infarction (caudate, lentiform nuclei, internal capsule, insula, M1-M3 cortex): 7 - Supraganglionic infarction (M4-M6 cortex): 3 Total score (0-10 with 10 being normal): 10 IMPRESSION: 1. Moderate atrophy.  No acute abnormality. 2. ASPECTS is 10 3. These results were called by telephone at the time of interpretation on 07/08/2019 at 8:07 pm to provider [facility] , who verbally acknowledged these results. Electronically Signed   By: Franchot Gallo M.D.   On: 07/08/2019 20:07    EKG: Independently reviewed. nsr  Assessment/Plan Principal Problem:   Acute metabolic encephalopathy Active Problems:   Acquired hypothyroidism   Aortic stenosis, moderate   Benign essential hypertension   Parkinsonism (HCC)   AF (paroxysmal atrial fibrillation) (HCC)   Complete heart block (HCC)   History of GI bleed   Chronic diastolic CHF (congestive heart failure) ([place])   # acute metabolic encephalopathy - initial concern for stroke. CT head neg, CTA pending. Neuro thinks more likely metabolic process. Bacteriuria on ua, this could be 2/2 uti. Remainder of labs unremarkable. Recent dx parkinsonism, would not suspect that to progress this rapidly but would increase risk  for delirium. - f/u CTA - stroke precautions - consider MRI in the morning, may need repeat sedation - f/u b12, tsh, ammonia, serum osm - tele overnight - swallow eval ordered - asa ordered  # Bacteriuria - poss uti - cont ceftriaxone, last urine culture sensitive to that - f/u culture  #HTN - here bp elevated, ok with some degree of htn while we r/o stroke - cont home metop, monitor - cont atorvastatin  # parkinsonism - cont sinemet for now  # paroxysmal a fib - in sinus now. Not anticoagualted 2/2 hx gib - cont metop  # hypothyroid - cont levo, f/u tsh    DVT prophylaxis: lovenox Code Status: dnr, confirmed w/ son  Family  Communication: son randall (a physician), called on phone  Disposition Plan: tbd  Consults called: neuro  Admission status: med/surg    Desma Maxim MD [facility] Pager [phone]  If 7PM-7AM, please contact night-coverage [email or web address] Password [employer]  07/08/2019, 11:25 PM

## 2019-07-08 NOTE — ED Triage Notes (Signed)
Pt presents to ER from home with complaints of AMS, per EMS pt had normal day had dinner about 40 minutes ago family left her home and caregiver was getting ready to put pt in bed and pt started to be combative, unable to talk, recently diagnosed with Parkinson's disease. Per EMS pt was calm upon arrival to ER pt removed her IV. Pt is awake, unable to verbalize any needs. No respiratory distress noted

## 2019-07-08 NOTE — Progress Notes (Signed)
CODE STROKE- PHARMACY COMMUNICATION   Time CODE STROKE called/page received:1953  Time response to CODE STROKE was made (in person or via phone): 2015  Time Stroke Kit retrieved from [place] (only if needed): NA  Name of Provider/Nurse contacted: Eyvonne Mechanic, RN - MD at bedside, will likely cancel Code Stroke   Tawnya Crook ,PharmD Clinical Pharmacist  07/08/2019  8:16 PM

## 2019-07-08 NOTE — ED Provider Notes (Signed)
[facility] Emergency Department Provider Note  Time seen: 7:50 PM  I have reviewed the triage vital signs and the nursing notes.   HISTORY  Chief Complaint Altered Mental Status   HPI Olivia [name] is a 86 y.o. female with a past medical history of hypertension, gastric reflux, hyperlipidemia, recently diagnosed Parkinson's, presents emergency department for acute onset of altered mental status.  According to EMS report patient was last seen normal approximately 1 hour ago.  Per report patient is alert and oriented x4 at baseline.  Acutely approximately 30 to 40 minutes ago patient was found to be altered unable to converse unable to speak well, did not appear to be following commands.  This is a drastic change from baseline per EMS report.  Here the patient is awake alert, will occasionally answer simple question like her name but is unable to answer other questions unable to follow commands.  Appears confused.   Past Medical History:  Diagnosis Date  . Acquired hypothyroidism 08/09/2015  . Aortic stenosis, moderate 02/07/2015   Stable 5/17, slightly worse 5/18  . 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 Atrial fibrillation ([place])   . Benign essential hypertension 08/09/2015  . Chicken pox   . Colon polyps   . Complication of anesthesia    Fentanyl causes nausea and vomiting  . GERD (gastroesophageal reflux disease)   . H/O: GI bleed 05/2018  . Hyperlipidemia, mixed 08/09/2015  . Medicare annual wellness visit, initial 04/12/18, 08/11/16, 02/11/16  . Parkinson's disease ([place])   . Presence of permanent cardiac pacemaker 11/03/2017   [facility].  Medtronic Azure XT Dr [id number]  . SCC (squamous cell carcinoma), face 06/19/2016  . Wears hearing aid in both ears     Patient Active Problem List   Diagnosis Date Noted  . GIB (gastrointestinal bleeding) 05/10/2018  . SVT (supraventricular tachycardia) ([place]) 05/04/2018  . SCC (squamous cell carcinoma), face 06/19/2016  .  Medicare annual wellness visit, initial 02/11/2016  . Acquired hypothyroidism 08/09/2015  . Benign essential hypertension 08/09/2015  . Hyperlipidemia, mixed 08/09/2015  . Aortic stenosis, moderate 02/07/2015    Past Surgical History:  Procedure Laterality Date  . AORTIC VALVE REPLACEMENT  10/2017   [facility]  . APPENDECTOMY    . AXILLARY LYMPH NODE BIOPSY     needle biopsy   . CHOLECYSTECTOMY    . COLONOSCOPY Left 05/12/2018   Procedure: COLONOSCOPY;  Surgeon: Lin Landsman, MD;  Location: [facility] ENDOSCOPY;  Service: Gastroenterology;  Laterality: Left;  . ESOPHAGOGASTRODUODENOSCOPY Left 05/12/2018   Procedure: ESOPHAGOGASTRODUODENOSCOPY (EGD);  Surgeon: Lin Landsman, MD;  Location: [facility] ENDOSCOPY;  Service: Gastroenterology;  Laterality: Left;  . INSERT / REPLACE / REMOVE PACEMAKER    . MOHS SURGERY  07/2016   Dr. Manley Mason  . TONSILLECTOMY    . WISDOM TOOTH EXTRACTION      Prior to Admission medications   Medication Sig Start Date End Date Taking? Authorizing Provider  acetaminophen (TYLENOL) 500 MG tablet Take 1,000 mg by mouth 2 (two) times daily.    [provider]  albuterol (PROVENTIL HFA;VENTOLIN HFA) 108 (90 Base) MCG/ACT inhaler Inhale 2 puffs into the lungs every 6 (six) hours as needed for wheezing or shortness of breath.    [provider]  atorvastatin (LIPITOR) 10 MG tablet Take 10 mg by mouth every other day.    [provider]  carbidopa-levodopa (SINEMET IR) 25-100 MG tablet Take 1 tablet by mouth 3 (three) times  daily.    [provider]  clobetasol cream (TEMOVATE) 0.05 % APPLY TOPICALLY 3 TIMES A WEEK 02/12/17   [provider]  CRANBERRY PO Take by mouth daily.    [provider]  Diaper Rash Products (A+D DIAPER RASH) CREA Apply topically daily.     [provider]  ferrous gluconate (FERGON) 324 MG tablet Take 324 mg by mouth daily. 04/19/18 05/18/19  [provider]  furosemide  (LASIX) 20 MG tablet Take 20 mg by mouth daily as needed for fluid.  05/10/18   [provider]  levothyroxine (SYNTHROID, LEVOTHROID) 50 MCG tablet TAKE ONE TABLET BY MOUTH DAILY 07/14/17   [provider]  metoprolol succinate (TOPROL XL) 25 MG 24 hr tablet Take 1 tablet (25 mg total) by mouth daily. 05/04/18 05/18/19  Eula Listen, MD  Multiple Vitamins-Minerals (MULTIVITAMIN ADULT) TABS Take 1 tablet by mouth daily.    [provider]  pantoprazole (PROTONIX) 40 MG tablet Take 1 tablet (40 mg total) by mouth daily. 05/13/18 05/18/19  [name], MD    Allergies  Allergen Reactions  . Ace Inhibitors     Other reaction(s): Cough  . Fentanyl Nausea And Vomiting  . Morphine Other (See Comments)    Other reaction(s): Muscle Pain  . Alendronate     Muscle pain    Family History  Problem Relation Age of Onset  . Hypertension Mother   . Prostate cancer Father   . Healthy Sister   . Aortic stenosis Brother     Social History Social History   Tobacco Use  . Smoking status: Never Smoker  . Smokeless tobacco: Never Used  Substance Use Topics  . Alcohol use: Not Currently  . Drug use: Never    Review of Systems Unable to obtain adequate/accurate review of systems secondary to altered mental status.  ____________________________________________   PHYSICAL EXAM:  VITAL SIGNS: ED Triage Vitals  Enc Vitals Group     BP 07/08/19 1929 (!) 158/65     Pulse Rate 07/08/19 1929 88     Resp 07/08/19 1929 18     Temp 07/08/19 1929 97.6 F (36.4 C)     Temp Source 07/08/19 1929 Oral     SpO2 07/08/19 1929 96 %     Weight 07/08/19 1934 156 lb 8.4 oz (71 kg)     Height 07/08/19 1934 5\' 6"  (1.676 m)     Head Circumference --      Peak Flow --      Pain Score --      Pain Loc --      Pain Edu? --      Excl. in [place]? --     Constitutional: Patient is awake and alert but does not follow commands will occasionally attempt to answer questions  sometimes with success most of the time without. Eyes: Normal exam ENT      Head: Normocephalic and atraumatic.      Mouth/Throat: Mucous membranes are moist. Cardiovascular: Normal rate, regular rhythm.  Respiratory: Normal respiratory effort without tachypnea nor retractions. Breath sounds are clear Gastrointestinal: Soft and nontender. No distention. Musculoskeletal: Nontender with normal range of motion in all extremities. Neurologic: Patient has a stuttering speech, often unable to answer questions.  Not able to follow commands.  Significantly confused.  Unable to obtain a more in-depth neurologic exam. Skin:  Skin is warm, dry and intact.  Psychiatric: Calm.  ____________________________________________    EKG  EKG viewed and interpreted by myself  shows a sinus rhythm 86 bpm with a slightly widened QRS, normal axis, largely normal intervals nonspecific ST changes.  ____________________________________________    RADIOLOGY  IMPRESSION:  1. Moderate atrophy. No acute abnormality.  2. ASPECTS is 10  3. These results were called by telephone at the time of  interpretation on 07/08/2019 at 8:07 pm to provider [facility]  , who verbally acknowledged these results.   ____________________________________________   INITIAL IMPRESSION / ASSESSMENT AND PLAN / ED COURSE  Pertinent labs & imaging results that were available during my care of the patient were reviewed by me and considered in my medical decision making (see chart for details).   Patient presents to the emergency department acute onset of altered mental status occurring approximately 40 minutes ago last known normal approximately 6:45 PM.  Awaiting family arrival for further history and confirmation of time line.  Near the patient is very confused, will look at you when you are speaking to her but does not follow commands.  Will occasionally answer simple questions such as her name, but cannot answer her birth  date or location, etc.  Given the acute change will obtain a CT scan.  I have made the patient a code stroke while awaiting further work-up and results.  Patient CT scan is negative.  I spoke to the neurologist who has evaluated the patient they recommend obtaining a CTA of the head as a precaution.  They have ordered this test for the patient.  Patient remains very jittery/tremulous.  We are attempting to sedate the patient somewhat so that she could tolerate CTA imaging.  Patient's lab work has resulted showing a significant urinary tract infection.  We will send urine culture we will admit to the hospitalist service and started on antibiotics.   Patient's son is now here he states over the past week or so the patient has had increased tremor, increased difficulty with speech, but tonight had an acute change in which she was no longer able to converse which is a major change per son.  As this appears to be somewhat of a more subacute or progressive course I do not believe TPA would be warranted but we will still get a stat neurological consultation to evaluate for this purpose as well as further recommendations.  I do believe the patient would benefit from an MRI.  Patient's carbidopa levodopa dose was recently increased however I do not believe it would cause such symptoms abruptly.      NIH Stroke Scale   Interval: Baseline Time: 7:54 PM Person Administering Scale: Harvest Dark  Administer stroke scale items in the order listed. Record performance in each category after each subscale exam. Do not go back and change scores. Follow directions provided for each exam technique. Scores should reflect what the patient does, not what the clinician thinks the patient can do. The clinician should record answers while administering the exam and work quickly. Except where indicated, the patient should not be coached (i.e., repeated requests to patient to make a special effort).   1a  Level of  consciousness: 0=alert; keenly responsive  1b. LOC questions:  2=Performs neither task correctly  1c. LOC commands: 2=Performs neither task correctly  2.  Best Gaze: 0=normal  3.  Visual: 0=No visual loss  4. Facial Palsy: 0=Normal symmetric movement  5a.  Motor left arm: 0=No drift, limb holds 90 (or 45) degrees for full 10 seconds  5b.  Motor right arm: 0=No drift, limb holds 90 (or 45)  degrees for full 10 seconds  6a. motor left leg: 0=No drift, limb holds 90 (or 45) degrees for full 10 seconds  6b  Motor right leg:  0=No drift, limb holds 90 (or 45) degrees for full 10 seconds  7. Limb Ataxia: 2=Present in two limbs  8.  Sensory: 0=Normal; no sensory loss  9. Best Language:  1=Mild to moderate aphasia; some obvious loss of fluency or facility of comprehension without significant limitation on ideas expressed or form of expression.  10. Dysarthria: 1=Mild to moderate, patient slurs at least some words and at worst, can be understood with some difficulty  11. Extinction and Inattention: 0=No abnormality  12. Distal motor function: 0=Normal   Total:   8 but severely limited due to patient cooperation    ASYAH CANDLER was evaluated in Emergency Department on 07/08/2019 for the symptoms described in the history of present illness. She was evaluated in the context of the global COVID-19 pandemic, which necessitated consideration that the patient might be at risk for infection with the SARS-CoV-2 virus that causes COVID-19. Institutional protocols and algorithms that pertain to the evaluation of patients at risk for COVID-19 are in a state of rapid change based on information released by regulatory bodies including the CDC and federal and state organizations. These policies and algorithms were followed during the patient's care in the ED.  ____________________________________________   FINAL CLINICAL IMPRESSION(S) / ED DIAGNOSES  Altered mental status Urinary tract infection   Harvest Dark, MD 07/08/19 2228

## 2019-07-08 NOTE — Consult Note (Addendum)
[facility] Tele[facility]urology Consult Services  Stat Consult  Date of Service:   07/08/2019 20:08:19  Impression:     .  G93.4 - Encephalopathy, unspecified  Comments/Sign-Out: acute onset confusion, difficulty speaking and mild agitation - metabolic/infectious encephalopathy favored over stroke. Outside tpa window. Recommend CTA head/neck to eval for LVO that may have cause an acute change this evening.  CTA/P head/neck negative for LVO; no role for thrombectomy.  CT HEAD: Showed No Acute Hemorrhage or Acute Core Infarct Reviewed  Metrics: [facility] Notification Time: 07/08/2019 20:06:04 Stamp Time: 07/08/2019 20:08:19 Callback Response Time: 07/08/2019 20:10:14  Our recommendations are outlined below.  Recommendations:     .  start ASA if CT head is neg for hemorrhage.   Imaging Studies:     .  MRI Head  Other WorkUp:     .  Check B12 level     .  Check TSH     .  Check Urinalysis  Disposition: Neurology Follow Up Recommended  Sign Out:     .  Discussed with Emergency Department Provider  ----------------------------------------------------------------------------------------------------  Chief Complaint: confusion  History of Present Illness: Patient is a 86 year old Female.  Ms. Olivia [name] is an 86 year old woman with acute onset difficulty speaking and following commands, staring at 6:45 pm. Her son has noted increasing difficulty with her speech over the past few days. Tonight, she is no longer able to communicate or follow commands. Her son is an obstetrician who states she was been diagnosed with Parkinson's in January and has had progressive tremors and slurred speech since that time. She is on sinemet 25/100; 3 tablets TID, which was increased 2 days ago. She lives in a cottage in a nursing home.           Examination: BP(158/65), Pulse(88), Blood Glucose(106) 1A: Level of Consciousness - Alert; keenly responsive + 0 1B: Ask  Month and Age - Could Not Answer Either Question Correctly + 2 1C: Blink Eyes & Squeeze Hands - Performs Both Tasks + 0 2: Test Horizontal Extraocular Movements - Normal + 0 3: Test Visual Fields - No Visual Loss + 0 4: Test Facial Palsy (Use Grimace if Obtunded) - Normal symmetry + 0 5A: Test Left Arm Motor Drift - Some Effort Against Gravity + 2 5B: Test Right Arm Motor Drift - Some Effort Against Gravity + 2 6A: Test Left Leg Motor Drift - No Effort Against Gravity + 3 6B: Test Right Leg Motor Drift - No Effort Against Gravity + 3 7: Test Limb Ataxia (FNF/Heel-Shin) - No Ataxia + 0 8: Test Sensation - Normal; No sensory loss + 0 9: Test Language/Aphasia - Severe Aphasia: Fragmentary Expression, Inference Needed, Cannot Identify Materials + 2 10: Test Dysarthria - Mute/Anarthric + 2 11: Test Extinction/Inattention - No abnormality + 0  NIHSS Score: 16    Due to the immediate potential for life-threatening deterioration due to underlying acute neurologic illness, I spent 25 minutes providing critical care. This time includes time for face to face visit via telemedicine, review of medical records, imaging studies and discussion of findings with providers, the patient and/or family.   Dr Hal Morales   [facility] [phone]  Case [id number]

## 2019-07-08 NOTE — ED Notes (Signed)
NEURO Tele Consult

## 2019-07-08 NOTE — ED Notes (Signed)
Pt very restless, agitated, took off gown, leads, unable to stay still for CT. Administered medications as ordered by Dr. Mamie Nick.  Pt's son at bedside very attentive to pt.  Will continue to monitor

## 2019-07-08 NOTE — ED Notes (Signed)
Admiting MD at bedside

## 2019-07-09 ENCOUNTER — Encounter: Payer: Self-pay | Admitting: Obstetrics and Gynecology

## 2019-07-09 DIAGNOSIS — J9859 Other diseases of mediastinum, not elsewhere classified: Secondary | ICD-10-CM

## 2019-07-09 DIAGNOSIS — I480 Paroxysmal atrial fibrillation: Secondary | ICD-10-CM

## 2019-07-09 DIAGNOSIS — R531 Weakness: Secondary | ICD-10-CM

## 2019-07-09 DIAGNOSIS — G20 Parkinson's disease: Secondary | ICD-10-CM

## 2019-07-09 DIAGNOSIS — R410 Disorientation, unspecified: Secondary | ICD-10-CM

## 2019-07-09 DIAGNOSIS — N3000 Acute cystitis without hematuria: Principal | ICD-10-CM

## 2019-07-09 DIAGNOSIS — E039 Hypothyroidism, unspecified: Secondary | ICD-10-CM

## 2019-07-09 LAB — CREATININE, SERUM
Creatinine, Ser: 0.84 mg/dL (ref 0.44–1.00)
GFR calc Af Amer: >60 mL/min (ref >60)
GFR calc non Af Amer: >60 mL/min (ref >60)

## 2019-07-09 LAB — OSMOLALITY: Osmolality: 290 mOsm/kg (ref 275–295)

## 2019-07-09 LAB — CBC
HCT: 34 % — ABNORMAL LOW (ref 36.0–46.0)
Hemoglobin: 11.7 g/dL — ABNORMAL LOW (ref 12.0–15.0)
MCH: 33 pg (ref 26.0–34.0)
MCHC: 34.4 g/dL (ref 30.0–36.0)
MCV: 95.8 fL (ref 80.0–100.0)
Platelets: 548 10*3/uL — ABNORMAL HIGH (ref 150–400)
RBC: 3.55 MIL/uL — ABNORMAL LOW (ref 3.87–5.11)
RDW: 13.8 % (ref 11.5–15.5)
WBC: 10.2 10*3/uL (ref 4.0–10.5)
nRBC: 0.2 % (ref 0.0–0.2)

## 2019-07-09 LAB — AMMONIA: Ammonia: 13 umol/L (ref 9–35)

## 2019-07-09 LAB — RESPIRATORY PANEL BY RT PCR (FLU A&B, COVID)
Influenza A by PCR: NEGATIVE
Influenza B by PCR: NEGATIVE
SARS Coronavirus 2 by RT PCR: NEGATIVE

## 2019-07-09 LAB — VITAMIN B12: Vitamin B-12: 489 pg/mL (ref 180–914)

## 2019-07-09 LAB — TSH: TSH: 2.026 u[IU]/mL (ref 0.350–4.500)

## 2019-07-09 MED ORDER — RISPERIDONE 1 MG/ML PO SOLN
1.0000 mg | Freq: Two times a day (BID) | ORAL | Status: DC
  Administered 2019-07-09 – 2019-07-10 (×3): 1 mg via ORAL
  Filled 2019-07-09 (×5): qty 1

## 2019-07-09 MED ORDER — QUETIAPINE FUMARATE 25 MG PO TABS
25.0000 mg | ORAL_TABLET | Freq: Every day | ORAL | Status: DC
  Administered 2019-07-09 – 2019-07-10 (×2): 25 mg via ORAL
  Filled 2019-07-09 (×2): qty 1

## 2019-07-09 MED ORDER — METOPROLOL SUCCINATE ER 25 MG PO TB24
25.0000 mg | ORAL_TABLET | Freq: Every day | ORAL | Status: DC
  Administered 2019-07-09 – 2019-07-12 (×3): 25 mg via ORAL
  Filled 2019-07-09 (×4): qty 1

## 2019-07-09 MED ORDER — SODIUM CHLORIDE 0.9 % IV SOLN
1.0000 g | INTRAVENOUS | Status: DC
  Administered 2019-07-09 – 2019-07-11 (×3): 1 g via INTRAVENOUS
  Filled 2019-07-09: qty 1
  Filled 2019-07-09: qty 10
  Filled 2019-07-09 (×2): qty 1

## 2019-07-09 MED ORDER — ACETAMINOPHEN 160 MG/5ML PO SOLN
650.0000 mg | ORAL | Status: DC | PRN
  Filled 2019-07-09: qty 20.3

## 2019-07-09 MED ORDER — PANTOPRAZOLE SODIUM 40 MG PO TBEC
40.0000 mg | DELAYED_RELEASE_TABLET | Freq: Every day | ORAL | Status: DC
  Administered 2019-07-09 – 2019-07-12 (×3): 40 mg via ORAL
  Filled 2019-07-09 (×4): qty 1

## 2019-07-09 MED ORDER — LABETALOL HCL 5 MG/ML IV SOLN
10.0000 mg | Freq: Four times a day (QID) | INTRAVENOUS | Status: DC | PRN
  Administered 2019-07-09: 04:00:00 10 mg via INTRAVENOUS
  Filled 2019-07-09: qty 4

## 2019-07-09 MED ORDER — LEVOTHYROXINE SODIUM 50 MCG PO TABS
50.0000 ug | ORAL_TABLET | Freq: Every day | ORAL | Status: DC
  Administered 2019-07-09 – 2019-07-12 (×3): 50 ug via ORAL
  Filled 2019-07-09 (×4): qty 1

## 2019-07-09 MED ORDER — LABETALOL HCL 5 MG/ML IV SOLN: 5.0000 mg | Freq: Four times a day (QID) | INTRAVENOUS | Status: DC | PRN

## 2019-07-09 MED ORDER — QUETIAPINE FUMARATE 25 MG PO TABS: 12.5000 mg | ORAL_TABLET | Freq: Every evening | ORAL | Status: DC | PRN

## 2019-07-09 MED ORDER — ACETAMINOPHEN 650 MG RE SUPP: 650.0000 mg | RECTAL | Status: DC | PRN

## 2019-07-09 MED ORDER — ENOXAPARIN SODIUM 40 MG/0.4ML ~~LOC~~ SOLN
40.0000 mg | SUBCUTANEOUS | Status: DC
  Administered 2019-07-09 – 2019-07-12 (×3): 40 mg via SUBCUTANEOUS
  Filled 2019-07-09 (×4): qty 0.4

## 2019-07-09 MED ORDER — ATORVASTATIN CALCIUM 20 MG PO TABS
20.0000 mg | ORAL_TABLET | Freq: Every day | ORAL | Status: DC
  Administered 2019-07-09 – 2019-07-12 (×3): 20 mg via ORAL
  Filled 2019-07-09 (×4): qty 1

## 2019-07-09 MED ORDER — SODIUM CHLORIDE 0.9 % IV SOLN: INTRAVENOUS | Status: DC

## 2019-07-09 MED ORDER — ACETAMINOPHEN 325 MG PO TABS: 650.0000 mg | ORAL_TABLET | ORAL | Status: DC | PRN

## 2019-07-09 MED ORDER — CARBIDOPA-LEVODOPA 25-100 MG PO TABS
2.5000 | ORAL_TABLET | Freq: Three times a day (TID) | ORAL | Status: DC
  Administered 2019-07-09 – 2019-07-12 (×8): 2.5 via ORAL
  Filled 2019-07-09 (×13): qty 2.5

## 2019-07-09 MED ORDER — ASPIRIN 81 MG PO CHEW
81.0000 mg | CHEWABLE_TABLET | Freq: Every day | ORAL | Status: DC
  Administered 2019-07-09 – 2019-07-12 (×3): 81 mg via ORAL
  Filled 2019-07-09 (×4): qty 1

## 2019-07-09 MED ORDER — ALBUTEROL SULFATE HFA 108 (90 BASE) MCG/ACT IN AERS
2.0000 | INHALATION_SPRAY | Freq: Four times a day (QID) | RESPIRATORY_TRACT | Status: DC | PRN
  Filled 2019-07-09: qty 6.7

## 2019-07-09 MED ORDER — STROKE: EARLY STAGES OF RECOVERY BOOK: Freq: Once | Status: AC

## 2019-07-09 MED ORDER — SENNOSIDES-DOCUSATE SODIUM 8.6-50 MG PO TABS: 1.0000 | ORAL_TABLET | Freq: Every evening | ORAL | Status: DC | PRN

## 2019-07-09 NOTE — Evaluation (Signed)
Occupational Therapy Evaluation Patient Details Name: Olivia [name] MRN: [id number] DOB: 04/05/1925 Today's Date: 07/09/2019    History of Present Illness Olivia [name] is a 86 y.o. female with medical history significant for hypothyroidism, atrial stenosis s/p tavr in 3710 complicated by complete heart block with pacemaker placed, GI bleed, paroxysmal a fib in the setting of a UTI, dCHF with normal EF in 2020, and GI bleed, who presents with delirium.    Clinical Impression   Ms Olivia [name] was seen for OT evaluation this date. Pt was pleasantly confused this date, oriented to self only. Prior to hospital admission, pt was in a nursing home receiving assist for ADLs. Pt presents to acute OT demonstrating impaired ADL performance, functional cognition, and functional mobility 2/2 decreased safety awareness, functional strength/ROM/coordination deficits, and decreased LB access. Pt currently requires MAX A self-drinking at bed level (keeps both hands together to bring cup towards mouth). TOTAL A self-feeding despite attempted hand over hand c built up handle. TOTAL A toileting bed level.  Pt would benefit from skilled OT to address noted impairments and functional limitations (see below for any additional details) in order to maximize safety and independence while minimizing falls risk and caregiver burden. Upon hospital discharge, recommend STR to maximize pt safety and return to PLOF.     Follow Up Recommendations  SNF;Supervision/Assistance - 24 hour    Equipment Recommendations  Other (comment)(TBD at next venue of care)    Recommendations for Other Services       Precautions / Restrictions Precautions Precautions: Fall Restrictions Weight Bearing Restrictions: No      Mobility Bed Mobility Overal bed mobility: Needs Assistance             General bed mobility comments: TOTAL A adjust trunk position and scoot higher in bed at bed level  Transfers                  General transfer comment: Not tested 2/2 limited command following and need for +2    Balance                                           ADL either performed or assessed with clinical judgement   ADL Overall ADL's : Needs assistance/impaired                                       General ADL Comments: MAX A self-drinking at bed level. TOTAL A self-feeding (attempted hand over hand c built up handle- TOTAL A). TOTAL A LB access at bed level. TOTAL A toileting bed level     Vision         Perception     Praxis      Pertinent Vitals/Pain Pain Assessment: No/denies pain     Hand Dominance     Extremity/Trunk Assessment Upper Extremity Assessment Upper Extremity Assessment: RUE deficits/detail;LUE deficits/detail;Difficult to assess due to impaired cognition(3-/5 grosslt (B grip, B shoulder flexion). Keeps hands together to bring to mouth. R shoulder flexion PROM WFL, L shoulder flexion PROM limited ~90*)   Lower Extremity Assessment Lower Extremity Assessment: Difficult to assess due to impaired cognition(PROM WFL grossly)       Communication Communication Communication: HOH(benefitted from repetition and gestures)   Cognition Arousal/Alertness: Awake/alert Behavior During Therapy: Flat  affect Overall Cognitive Status: No family/caregiver present to determine baseline cognitive functioning                                 General Comments: Dementia at baseline; Pt not oriented to location/situation/time   General Comments  Start of session seated upright in bed: BP 166/108, MAP 129 - of note, pt difficulty keeping arm still. Reclined in bed HOB ~30*: BP 132/81, MAP 90, HR 95, RR 25, SpO2 96% on RA    Exercises Exercises: Other exercises Other Exercises Other Exercises: Pt educated re: call bell - pt unable to demonstrate how to call for NSG Other Exercises: Self-feeding/drinking, bed mobility, toileting at bed level,  assist phone calls   Shoulder Instructions      Home Living Family/patient expects to be discharged to:: Skilled nursing facility(Per [facility], pt resides in nursing home)                                        Prior Functioning/Environment Level of Independence: Needs assistance    ADL's / Homemaking Assistance Needed: Assist for all ADLs since diagnosis of Parkinson's Disease in Jan 2021            OT Problem List: Decreased strength;Decreased range of motion;Decreased activity tolerance;Decreased coordination;Decreased safety awareness      OT Treatment/Interventions: Self-care/ADL training;Therapeutic exercise;Neuromuscular education;Energy conservation;DME and/or AE instruction;Therapeutic activities;Cognitive remediation/compensation;Patient/family education;Balance training    OT Goals(Current goals can be found in the care plan section) Acute Rehab OT Goals Patient Stated Goal: Pt unable to state OT Goal Formulation: Patient unable to participate in goal setting Time For Goal Achievement: 07/23/19 Potential to Achieve Goals: Fair ADL Goals Pt Will Perform Eating: with max assist;bed level;with set-up;with caregiver independent in assisting Pt Will Perform Grooming: with max assist;bed level;with caregiver independent in assisting Pt Will Transfer to Toilet: with max assist(at bed level)  OT Frequency: Min 1X/week   Barriers to D/C:            Co-evaluation              AM-PAC OT "6 Clicks" Daily Activity     Outcome Measure Help from another person eating meals?: A Lot Help from another person taking care of personal grooming?: A Lot Help from another person toileting, which includes using toliet, bedpan, or urinal?: Total Help from another person bathing (including washing, rinsing, drying)?: A Lot Help from another person to put on and taking off regular upper body clothing?: A Lot Help from another person to put on and taking off regular  lower body clothing?: Total 6 Click Score: 10   End of Session Nurse Communication: Other (comment)(Pt unable to demonstrate use of call bell; Vitals )  Activity Tolerance: Patient tolerated treatment well Patient left: in bed;with call bell/phone within reach;with bed alarm set  OT Visit Diagnosis: Muscle weakness (generalized) (M62.81);Other abnormalities of gait and mobility (R26.89)                Time: 1000-1030 OT Time Calculation (min): 30 min Charges:  OT General Charges $OT Visit: 1 Visit OT Evaluation $OT Eval Moderate Complexity: 1 Mod OT Treatments $Self Care/Home Management : 23-37 mins  Dessie Coma, M.S. OTR/L  07/09/19, 11:21 AM

## 2019-07-09 NOTE — Progress Notes (Addendum)
SLP Cancellation Note  Patient Details Name: Olivia [name] MRN: [id number] DOB: 01-23-1926   Cancelled treatment:       Reason Eval/Treat Not Completed: Medical issues which prohibited therapy;Patient not medically ready(chart reviewed; consulted Son, then [place]). Pt has a Baseline of Parkinson's Dis.(on Sinemet) per Son. She currently is being treated for potential UTI/infection which can significantly impact mental status especially w/ premorbid Cognitive decline. Noted pt was able to engage in simple verbal exchange w/ Son(y/n). ST services recommends f/u w/ PCP at discharge post the Acute illness/infection recovery to determine change or decline in communication abilities from baseline to indicate need f/u w/ skilled ST services for cognitive-communication. Pt was diagnosed with Parkinson's Dis. in January and has had progressive tremors and slurred speech since that time. She requires full feeding assistance at meals per Son. She resides at a NH. Recommend reducing distractions in room during conversation to allow pt to focus and attend to speaker.      Orinda Kenner, [place], CCC-SLP Ayaat Jansma 07/09/2019, 10:26 AM

## 2019-07-09 NOTE — Evaluation (Signed)
Physical Therapy Evaluation Patient Details Name: Olivia [name] MRN: [id number] DOB: 1925-04-29 Today's Date: 07/09/2019   History of Present Illness  Olivia [name] is a 86 y.o. female with medical history significant for hypothyroidism, atrial stenosis s/p tavr in 9702 complicated by complete heart block with pacemaker placed, GI bleed, paroxysmal a fib in the setting of a UTI, dCHF with normal EF in 2020. Earlier this week developed some worsening problem with memory, and then yesterday more trouble walking and speaking. Son came to visit her this afternoon and noticed around 5 PM things changed abruptly and she became "almost catatonic"  Clinical Impression  Pt is a pleasant but confused 86 year old F who was admitted for acute metabolic encephalopathy with PMH as described above. Pt is alert initially though demonstrating mild lethargy throughout, closing eyes at times, though easily arousable. Pt is oriented somewhat to self and location, stating her date of birth and endorsing correctly with yes/no questions of her location. Pt able to follow some simple 1-step commands with tactile cuing and demonstration. However, pt demos v-tach today with bed mobility attempts as evident by telemonitor alarm. Alarm discontinues; however, with repeated attempt with bed mobility pt again demos V-tach. RN notified and brought in-room. Pt also begins to produce a foamy saliva, with mild distress at inability to expectorate; per RN, this saliva production is not new. Discontinued PT session as a result of v-tach with mobility. Pt repositioned in bed with assistance of RN. Of note, pt removing Purewick throughout session today, and sheets noted to be wet; nurse aware and states she will notify tech.  Pt demonstrates deficits with cognition and tachycardic response to mobility today, limiting ability to fully assess functional capabilities. At baseline, per son's report in EMR, pt was ambulatory and able to converse up  until about a week ago when she began to experience decline; pt lives in ALF with 24/7 care per EMR.     Follow Up Recommendations SNF;Supervision/Assistance - 24 hour    Equipment Recommendations  Other (comment)(TBD; limited ability to evaluate needs today)    Recommendations for Other Services       Precautions / Restrictions Precautions Precautions: Fall Restrictions Weight Bearing Restrictions: No      Mobility  Bed Mobility Overal bed mobility: Needs Assistance             General bed mobility comments: With attempts at bed mobility today, pt's telemonitor begins alerting of V-tach. Pulse assessed manually with no palpable tachycardia and pulse ox reading HR 96, O2 94. Alarm stops. With resumed attempts at mobility, telemonitor alarm begins to sound again. RN notified and brought into room, though alarm again stops. Pt also begins to produce saliva of foamy consistency and appears in mild distress with inability to spit to clear. RN notified and brought in room; nurse confirms saliva outout is not new. However, discussed with nurse discontinuing this PT session as a precaution.  Transfers                 General transfer comment: Deferred secondary to pt's v-tach response with mobility, limited ability to follow commands and production of saliva with inability to clear independently.  Ambulation/Gait             General Gait Details: Deferred  Stairs            Wheelchair Mobility    Modified Rankin (Stroke Patients Only)       Balance  Pertinent Vitals/Pain Pain Assessment: Faces Faces Pain Scale: No hurt Pain Location: Does not appear in acute distress and nods yes when asked if she is OK    Home Living Family/patient expects to be discharged to:: Skilled nursing facility                 Additional Comments: Pt lives in ALF; difficulty assessing baseline functional  capabilities, but per documentation in EMR, pt was ambulatory prior to recent functional decline    Prior Function Level of Independence: Needs assistance      ADL's / Homemaking Assistance Needed: Per documentation in EMR, assist for all ADLs since diagnosis of Parkinson's Disease in Jan 2021        Hand Dominance        Extremity/Trunk Assessment   Upper Extremity Assessment Upper Extremity Assessment: Generalized weakness;Difficult to assess due to impaired cognition(able to follow 1-step commands with tactile cuing for UE movements; range of motion grossly WNLs bilaterally)    Lower Extremity Assessment Lower Extremity Assessment: Generalized weakness;Difficult to assess due to impaired cognition       Communication   Communication: HOH(limited secondary to cognitive impairment)  Cognition Arousal/Alertness: Awake/alert(at times, closing eyes as sleeping but easily arousable) Behavior During Therapy: Flat affect Overall Cognitive Status: No family/caregiver present to determine baseline cognitive functioning                                 General Comments: Per EMR, son describes her as "relatively high functioning, ambulatory and conversant" up until last week, where she has declined. Today, pt unable to state name, though states date of birth and able to recall son's name. Endorses location at hospital, stating "yes" when asked, and "no" when asked if she is home.      General Comments General comments (skin integrity, edema, etc.): Seated upright in bed at start: HR: 86 bpm, O2 90% on RA (though slouched)    Exercises Other Exercises Other Exercises: Pt educated re: call bell - pt unable to demonstrate how to call for NSG Other Exercises: Self-feeding/drinking, bed mobility, toileting at bed level, assist phone calls   Assessment/Plan    PT Assessment Patient needs continued PT services  PT Problem List Decreased strength;Decreased  mobility;Decreased safety awareness;Decreased activity tolerance       PT Treatment Interventions DME instruction;Therapeutic exercise;Gait training;Functional mobility training;Patient/family education;Therapeutic activities;Neuromuscular re-education;Balance training    PT Goals (Current goals can be found in the Care Plan section)  Acute Rehab PT Goals Patient Stated Goal: Pt unable to state    Frequency Min 2X/week   Barriers to discharge        Co-evaluation               AM-PAC PT "6 Clicks" Mobility  Outcome Measure Help needed turning from your back to your side while in a flat bed without using bedrails?: A Lot Help needed moving from lying on your back to sitting on the side of a flat bed without using bedrails?: A Lot Help needed moving to and from a bed to a chair (including a wheelchair)?: A Lot Help needed standing up from a chair using your arms (e.g., wheelchair or bedside chair)?: A Lot Help needed to walk in hospital room?: Total Help needed climbing 3-5 steps with a railing? : Total 6 Click Score: 10    End of Session   Activity Tolerance: Other (comment)(pt demos v-tach  with mobility attempts, as well as producing foamy oral discharge which she is unable to expectorate) Patient left: in bed;with call bell/phone within reach;with bed alarm set Nurse Communication: Mobility status PT Visit Diagnosis: Muscle weakness (generalized) (M62.81);Difficulty in walking, not elsewhere classified (R26.2)    Time: 1287-8676 PT Time Calculation (min) (ACUTE ONLY): 25 min   Charges:   PT Evaluation $PT Eval High Complexity: 1 High          Petra Kuba, PT, DPT 07/09/19, 1:19 PM

## 2019-07-09 NOTE — Evaluation (Signed)
Clinical/Bedside Swallow Evaluation Patient Details  Name: Olivia [name] MRN: [id number] Date of Birth: 08/01/1925  Today's Date: 07/09/2019 Time: SLP Start Time (ACUTE ONLY): 0850 SLP Stop Time (ACUTE ONLY): 0950 SLP Time Calculation (min) (ACUTE ONLY): 60 min  Past Medical History:  Past Medical History:  Diagnosis Date  . Acquired hypothyroidism 08/09/2015  . Aortic stenosis, moderate 02/07/2015   Stable 5/17, slightly worse 5/18  . 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 Atrial fibrillation ([place])   . Benign essential hypertension 08/09/2015  . Chicken pox   . Colon polyps   . Complication of anesthesia    Fentanyl causes nausea and vomiting  . GERD (gastroesophageal reflux disease)   . H/O: GI bleed 05/2018  . Hyperlipidemia, mixed 08/09/2015  . Medicare annual wellness visit, initial 04/12/18, 08/11/16, 02/11/16  . Parkinson's disease ([place])   . Presence of permanent cardiac pacemaker 11/03/2017   [facility].  Medtronic Azure XT Dr [id number]  . SCC (squamous cell carcinoma), face 06/19/2016  . Wears hearing aid in both ears    Past Surgical History:  Past Surgical History:  Procedure Laterality Date  . AORTIC VALVE REPLACEMENT  10/2017   [facility]  . APPENDECTOMY    . AXILLARY LYMPH NODE BIOPSY     needle biopsy   . CHOLECYSTECTOMY    . COLONOSCOPY Left 05/12/2018   Procedure: COLONOSCOPY;  Surgeon: Lin Landsman, MD;  Location: [facility] ENDOSCOPY;  Service: Gastroenterology;  Laterality: Left;  . ESOPHAGOGASTRODUODENOSCOPY Left 05/12/2018   Procedure: ESOPHAGOGASTRODUODENOSCOPY (EGD);  Surgeon: Lin Landsman, MD;  Location: [facility] END[facility]COPY;  Service: Gastroenterology;  Laterality: Left;  . INSERT / REPLACE / REMOVE PACEMAKER    . MOHS SURGERY  07/2016   Dr. Manley Mason  . TONSILLECTOMY    . WISDOM TOOTH EXTRACTION     HPI:  Pt is a 86 y.o. female with medical history significant for hypothyroidism, GERD, atrial stenosis s/p tavr in 2505 complicated by complete heart block with  pacemaker placed, GI bleed, paroxysmal a fib in the setting of a UTI, UTIs, CHF with normal EF in 2020, and GI bleed, who presents with altered mental status; potential UTI.  She was diagnosed with Parkinson's Dis. in January 2021 and has had progressive tremors and slurred speech since that time. She is on Sinemet 25/100; 3 tablets TID, which was increased 2 days ago. She lives at a cottage in a [place].    Assessment / Plan / Recommendation Clinical Impression  Pt appears to present w/ min s/s of oropharyngeal phase dysphagia, though adequate bolus management and swallowing of trials assessed when given support and following strict aspiration precautions. No overt, clinical s/s of aspiration noted w/ po trial consistencies given at this eval. Pt has baseline dx of Parkinson's Dis. since Jan. 2021 w/ decline in status including tremors, inability to feed self, and reduced speech. Pt requires assistance at her [facility] w/ feeding per Son. Pt's Cognitive status appears to be improving since admission, though declined. Unsure of full Cognitive status at baseline at [facility]. During po trials, pt consumed all consistencies w/ no overt coughing, decline in vocal quality, or change in respiratory presentation during/post trials. Audible swallows and min decreased awareness of po tasks was noted moreson w/ Thin liquids. Pt helped to assist w/ holding of Cup but needed full support d/t inability to use (tremorous) hands. Oral phase appeared grossly [facility] w/ bolus management, mastication of ice chips, and control of bolus propulsion for  A-P transfer for swallowing. Oral clearing achieved w/ all trial consistencies. Min increased mastication and time noted w/ the increased textured trials of Puree - suspect related to oral phase deficits seen w/ Cognitive decline. OM Exam appeared [facility] w/ no unilateral weakness noted; lingual bunching during oral care. Speech Clear, low volume. Pt required full feeding assistance.  Recommend a pureed diet consistency w/ Thin liquidsvia cup/straw - may have to monitor straw sipping for larger sips. Recommend precautions, Pills Whole in Puree for safer, easier swallowing. Education given on general aspiration precautions w/ Son while present. NSG and MD updated. ST services will continue to f/u for trials to upgrade diet as appropriate.  SLP Visit Diagnosis: Dysphagia, oropharyngeal phase (R13.12)    Aspiration Risk  Mild aspiration risk;Risk for inadequate nutrition/hydration    Diet Recommendation  Pureed diet (dys. Level 1) w/ thin liquids; aspiration precautions; feeding support and assistance at all meals. Reduce distractions during meals.   Medication Administration: Whole meds with puree(as able vs Crushed; for safer swallowing)    Other  Recommendations Recommended Consults: (Dietician f/u) Oral Care Recommendations: Oral care BID;Oral care before and after PO;Staff/trained caregiver to provide oral care Other Recommendations: (n/a currently)   Follow up Recommendations (TBD)      Frequency and Duration min 3x week  2 weeks       Prognosis Prognosis for Safe Diet Advancement: Fair Barriers to Reach Goals: Cognitive deficits;Time post onset;Severity of deficits      Swallow Study   General Date of Onset: 07/08/19 HPI: Pt is a 86 y.o. female with medical history significant for hypothyroidism, GERD, atrial stenosis s/p tavr in 1610 complicated by complete heart block with pacemaker placed, GI bleed, paroxysmal a fib in the setting of a UTI, UTIs, CHF with normal EF in 2020, and GI bleed, who presents with altered mental status; potential UTI.  She was diagnosed with Parkinson's Dis. in January 2021 and has had progressive tremors and slurred speech since that time. She is on Sinemet 25/100; 3 tablets TID, which was increased 2 days ago. She lives at a cottage in a [place].  Type of Study: Bedside Swallow Evaluation Previous Swallow Assessment: none  noted Diet Prior to this Study: NPO Temperature Spikes Noted: No(wbc 10.2) Respiratory Status: Room air History of Recent Intubation: No Behavior/Cognition: Alert;Cooperative;Pleasant mood;Confused;Distractible;Requires cueing Oral Cavity Assessment: Dry(sticky) Oral Care Completed by SLP: Yes Oral Cavity - Dentition: Adequate natural dentition Vision: Functional for self-feeding Self-Feeding Abilities: Needs assist;Needs set up;Total assist Patient Positioning: Upright in bed(needed full positioning) Baseline Vocal Quality: Low vocal intensity(adequate) Volitional Cough: Cognitively unable to elicit Volitional Swallow: Unable to elicit    Oral/Motor/Sensory Function Overall Oral Motor/Sensory Function: Generalized oral weakness(noted tongue bunching during oral care) Facial ROM: Within Functional Limits Facial Symmetry: Within Functional Limits Lingual ROM: Within Functional Limits Lingual Symmetry: Within Functional Limits Mandible: Within Functional Limits   Ice Chips Ice chips: Within functional limits Presentation: Spoon(5 trials) Other Comments: pt masticated thoroughly   Thin Liquid Thin Liquid: Impaired Presentation: Cup;Straw(she helped to support; 10 trials) Oral Phase Impairments: Poor awareness of bolus(min) Oral Phase Functional Implications: (none) Pharyngeal  Phase Impairments: (audible swallows)    Nectar Thick Nectar Thick Liquid: Within functional limits Presentation: Spoon;Straw(5 trials) Other Comments: min improved management overall   Honey Thick Honey Thick Liquid: Not tested   Puree Puree: Impaired Presentation: Spoon(fed; 10 trials) Oral Phase Impairments: Poor awareness of bolus Oral Phase Functional Implications: Prolonged oral transit(min increased mastication time w/ puree)  Pharyngeal Phase Impairments: (none)   Solid     Solid: Not tested Other Comments: will f/u w/ trials       Orinda Kenner, MS,  CCC-SLP Trai Ells 07/09/2019,11:07 AM

## 2019-07-09 NOTE — Progress Notes (Signed)
Patient ID: Olivia [name], female   DOB: [birth year], 86 y.o.   MRN: [id number] [facility] PROGRESS NOTE  Olivia [name] NOB:[id number] DOB: [birth year] [name]: 07/08/2019 PCP: Rusty Aus, MD  HPI/Subjective: Patient with difficulty hearing me.  She was able to move all of her extremities to command.  She states that she did not eat well.  She states that she is weak.  No chest pain or shortness of breath.  No nausea or vomiting.  Objective: Vitals:   07/09/19 0700 07/09/19 1227  BP: (!) 137/57   Pulse: 99 86  Resp: 19   Temp: 99.6 F (37.6 C)   SpO2: 95% 90%    Intake/Output Summary (Last 24 hours) at 07/09/2019 1306 Last data filed at 07/09/2019 0700 Gross per 24 hour  Intake 489.41 ml  Output 300 ml  Net 189.41 ml   Filed Weights   07/08/19 1934 07/09/19 0023  Weight: 71 kg 71 kg    ROS: Review of Systems  Unable to perform ROS: Acuity of condition  Respiratory: Negative for shortness of breath.   Cardiovascular: Negative for chest pain.  Gastrointestinal: Negative for abdominal pain, nausea and vomiting.  Musculoskeletal: Negative for joint pain.   Exam: Physical Exam  HENT:  Nose: No mucosal edema.  Mouth/Throat: No oropharyngeal exudate or posterior oropharyngeal edema.  Eyes: Pupils are equal, round, and reactive to light. Conjunctivae and lids are normal.  Neck: Carotid bruit is not present.  Cardiovascular: S1 normal and S2 normal. Exam reveals no gallop.  No murmur heard. Respiratory: No respiratory distress. She has decreased breath sounds in the right lower field and the left lower field. She has no wheezes. She has no rhonchi. She has no rales.  GI: Soft. Bowel sounds are normal. There is no abdominal tenderness.  Musculoskeletal:     Right ankle: No swelling.     Left ankle: No swelling.  Lymphadenopathy:    She has no cervical adenopathy.  Neurological: She is alert.  Patient able to straight leg raise bilaterally.  Patient able to lift her arms up  overhead.  Skin: Skin is warm. No rash noted. Nails show no clubbing.  Psychiatric:  When she can hear me she answers questions appropriately and follows commands      Data Reviewed: Basic Metabolic Panel: Recent Labs  Lab 07/08/19 1942 07/09/19 0259  NA 135  --   K 4.0  --   CL 100  --   CO2 24  --   GLUCOSE 106*  --   BUN 18  --   CREATININE 0.92 0.84  CALCIUM 9.8  --    Liver Function Tests: Recent Labs  Lab 07/08/19 1942  AST 31  ALT 6  ALKPHOS 147*  BILITOT 1.1  PROT 6.5  ALBUMIN 4.1    Recent Labs  Lab 07/09/19 0605  AMMONIA 13   CBC: Recent Labs  Lab 07/08/19 1942 07/09/19 0259  WBC 7.4 10.2  HGB 10.6* 11.7*  HCT 31.3* 34.0*  MCV 97.5 95.8  PLT 504* 548*     Recent Results (from the past 240 hour(s))  Respiratory Panel by RT PCR (Flu A&B, Covid) - Nasopharyngeal Swab     Status: None   Collection Time: 07/08/19 11:25 PM   Specimen: Nasopharyngeal Swab  Result Value Ref Range Status   SARS Coronavirus 2 by RT PCR NEGATIVE NEGATIVE Final    Comment: (NOTE) SARS-CoV-2 target nucleic acids are NOT DETECTED. The SARS-CoV-2 RNA is generally detectable in  upper respiratoy specimens during the acute phase of infection. The lowest concentration of SARS-CoV-2 viral copies this assay can detect is 131 copies/mL. A negative result does not preclude SARS-Cov-2 infection and should not be used as the sole basis for treatment or other patient management decisions. A negative result may occur with  improper specimen collection/handling, submission of specimen other than nasopharyngeal swab, presence of viral mutation(s) within the areas targeted by this assay, and inadequate number of viral copies (<131 copies/mL). A negative result must be combined with clinical observations, patient history, and epidemiological information. The expected result is Negative. Fact Sheet for Patients:  PinkCheek.be Fact Sheet for Healthcare  Providers:  GravelBags.it This test is not yet ap proved or cleared by the [place] FDA and  has been authorized for detection and/or diagnosis of SARS-CoV-2 by FDA under an Emergency Use Authorization (EUA). This EUA will remain  in effect (meaning this test can be used) for the duration of the COVID-19 declaration under Section 564(b)(1) of the Act, 21 U.S.C. section 360bbb-3(b)(1), unless the authorization is terminated or revoked sooner.    Influenza A by PCR NEGATIVE NEGATIVE Final   Influenza B by PCR NEGATIVE NEGATIVE Final    Comment: (NOTE) The Xpert Xpress SARS-CoV-2/FLU/RSV assay is intended as an aid in  the diagnosis of influenza from Nasopharyngeal swab specimens and  should not be used as a sole basis for treatment. Nasal washings and  aspirates are unacceptable for Xpert Xpress SARS-CoV-2/FLU/RSV  testing. Fact Sheet for Patients: PinkCheek.be Fact Sheet for Healthcare Providers: GravelBags.it This test is not yet approved or cleared by the [place] FDA and  has been authorized for detection and/or diagnosis of SARS-CoV-2 by  FDA under an Emergency Use Authorization (EUA). This EUA will remain  in effect (meaning this test can be used) for the duration of the  Covid-19 declaration under Section 564(b)(1) of the Act, 21  U.S.C. section 360bbb-3(b)(1), unless the authorization is  terminated or revoked. Performed at [facility], [address] [place], [place] [address]      Studies: CT ANGIO HEAD W OR WO CONTRAST  Result Date: 07/09/2019 CLINICAL DATA:  Acute onset aphasia EXAM: CT ANGIOGRAPHY HEAD AND NECK CT PERFUSION BRAIN TECHNIQUE: Multidetector CT imaging of the head and neck was performed using the standard protocol during bolus administration of intravenous contrast. Multiplanar CT image reconstructions and MIPs were obtained to evaluate the vascular  anatomy. Carotid stenosis measurements (when applicable) are obtained utilizing NASCET criteria, using the distal internal carotid diameter as the denominator. Multiphase CT imaging of the brain was performed following IV bolus contrast injection. Subsequent parametric perfusion maps were calculated using RAPID software. CONTRAST:  168mL OMNIPAQUE IOHEXOL 350 MG/ML SOLN COMPARISON:  None. FINDINGS: CT HEAD FINDINGS Brain: There is no mass, hemorrhage or extra-axial collection. There is generalized atrophy without lobar predilection. There is no acute or chronic infarction. There is hypoattenuation of the periventricular white matter, most commonly indicating chronic ischemic microangiopathy. Skull: The visualized skull base, calvarium and extracranial soft tissues are normal. Sinuses/Orbits: No fluid levels or advanced mucosal thickening of the visualized paranasal sinuses. Partial right mastoid opacification. No middle ear effusion. The orbits are normal. CTA NECK FINDINGS SKELETON: There is no bony spinal canal stenosis. No lytic or blastic lesion. OTHER NECK: Heterogeneous mass within the mediastinum, abutting the right side of the aortic arch and the anterior surface of the trachea, measures 3.3 x 3.5 cm (series 3, image 9). UPPER CHEST: No pneumothorax  or pleural effusion. No nodules or masses. AORTIC ARCH: There is mild calcific atherosclerosis of the aortic arch. There is no aneurysm, dissection or hemodynamically significant stenosis of the visualized portion of the aorta. Conventional 3 vessel aortic branching pattern. The visualized proximal subclavian arteries are widely patent. RIGHT CAROTID SYSTEM: No dissection, occlusion or aneurysm. There is mixed density atherosclerosis extending into the proximal ICA, resulting in less than 50% stenosis. LEFT CAROTID SYSTEM: No dissection, occlusion or aneurysm. There is mixed density atherosclerosis extending into the proximal ICA, resulting in less than 50%  stenosis. VERTEBRAL ARTERIES: Left dominant configuration. Atherosclerotic calcification narrows the right vertebral artery origin. There is no dissection, occlusion or flow-limiting stenosis to the skull base (V1-V3 segments). CTA HEAD FINDINGS POSTERIOR CIRCULATION: --Vertebral arteries: Normal V4 segments. --Posterior inferior cerebellar arteries (PICA): Patent origins from the vertebral arteries. --Anterior inferior cerebellar arteries (AICA): Patent origins from the basilar artery. --Basilar artery: Normal. --Superior cerebellar arteries: Normal. --Posterior cerebral arteries: Normal. Both originate from the basilar artery. Posterior communicating arteries (p-comm) are diminutive or absent. ANTERIOR CIRCULATION: --Intracranial internal carotid arteries: Atherosclerotic calcification of the internal carotid arteries at the skull base without hemodynamically significant stenosis. --Anterior cerebral arteries (ACA): Normal. Both A1 segments are present. Patent anterior communicating artery (a-comm). --Middle cerebral arteries (MCA): Normal. VENOUS SINUSES: As permitted by contrast timing, patent. ANATOMIC VARIANTS: None Review of the MIP images confirms the above findings. CT Brain Perfusion Findings: ASPECTS: 10 CBF (<30%) Volume: 70mL Perfusion (Tmax>6.0s) volume: 61mL Mismatch Volume: 67mL Infarction Location:None IMPRESSION: 1. No emergent large vessel occlusion or high-grade stenosis of the intracranial arteries. 2. Normal perfusion examination. 3. Heterogeneous mass within the mediastinum, abutting the right side of the aortic arch and the anterior surface of the trachea, measuring 3.3 x 3.5 cm, most consistent with a metastatic lymph node. There is also enlarged right supraclavicular lymphadenopathy. 4. Aortic Atherosclerosis (ICD10-I70.0). Electronically Signed   By: Ulyses Jarred M.D.   On: 07/09/2019 00:28   CT ANGIO NECK W OR WO CONTRAST  Result Date: 07/09/2019 CLINICAL DATA:  Acute onset aphasia EXAM:  CT ANGIOGRAPHY HEAD AND NECK CT PERFUSION BRAIN TECHNIQUE: Multidetector CT imaging of the head and neck was performed using the standard protocol during bolus administration of intravenous contrast. Multiplanar CT image reconstructions and MIPs were obtained to evaluate the vascular anatomy. Carotid stenosis measurements (when applicable) are obtained utilizing NASCET criteria, using the distal internal carotid diameter as the denominator. Multiphase CT imaging of the brain was performed following IV bolus contrast injection. Subsequent parametric perfusion maps were calculated using RAPID software. CONTRAST:  142mL OMNIPAQUE IOHEXOL 350 MG/ML SOLN COMPARISON:  None. FINDINGS: CT HEAD FINDINGS Brain: There is no mass, hemorrhage or extra-axial collection. There is generalized atrophy without lobar predilection. There is no acute or chronic infarction. There is hypoattenuation of the periventricular white matter, most commonly indicating chronic ischemic microangiopathy. Skull: The visualized skull base, calvarium and extracranial soft tissues are normal. Sinuses/Orbits: No fluid levels or advanced mucosal thickening of the visualized paranasal sinuses. Partial right mastoid opacification. No middle ear effusion. The orbits are normal. CTA NECK FINDINGS SKELETON: There is no bony spinal canal stenosis. No lytic or blastic lesion. OTHER NECK: Heterogeneous mass within the mediastinum, abutting the right side of the aortic arch and the anterior surface of the trachea, measures 3.3 x 3.5 cm (series 3, image 9). UPPER CHEST: No pneumothorax or pleural effusion. No nodules or masses. AORTIC ARCH: There is mild calcific atherosclerosis of the aortic arch. There  is no aneurysm, dissection or hemodynamically significant stenosis of the visualized portion of the aorta. Conventional 3 vessel aortic branching pattern. The visualized proximal subclavian arteries are widely patent. RIGHT CAROTID SYSTEM: No dissection, occlusion  or aneurysm. There is mixed density atherosclerosis extending into the proximal ICA, resulting in less than 50% stenosis. LEFT CAROTID SYSTEM: No dissection, occlusion or aneurysm. There is mixed density atherosclerosis extending into the proximal ICA, resulting in less than 50% stenosis. VERTEBRAL ARTERIES: Left dominant configuration. Atherosclerotic calcification narrows the right vertebral artery origin. There is no dissection, occlusion or flow-limiting stenosis to the skull base (V1-V3 segments). CTA HEAD FINDINGS POSTERIOR CIRCULATION: --Vertebral arteries: Normal V4 segments. --Posterior inferior cerebellar arteries (PICA): Patent origins from the vertebral arteries. --Anterior inferior cerebellar arteries (AICA): Patent origins from the basilar artery. --Basilar artery: Normal. --Superior cerebellar arteries: Normal. --Posterior cerebral arteries: Normal. Both originate from the basilar artery. Posterior communicating arteries (p-comm) are diminutive or absent. ANTERIOR CIRCULATION: --Intracranial internal carotid arteries: Atherosclerotic calcification of the internal carotid arteries at the skull base without hemodynamically significant stenosis. --Anterior cerebral arteries (ACA): Normal. Both A1 segments are present. Patent anterior communicating artery (a-comm). --Middle cerebral arteries (MCA): Normal. VENOUS SINUSES: As permitted by contrast timing, patent. ANATOMIC VARIANTS: None Review of the MIP images confirms the above findings. CT Brain Perfusion Findings: ASPECTS: 10 CBF (<30%) Volume: 65mL Perfusion (Tmax>6.0s) volume: 69mL Mismatch Volume: 89mL Infarction Location:None IMPRESSION: 1. No emergent large vessel occlusion or high-grade stenosis of the intracranial arteries. 2. Normal perfusion examination. 3. Heterogeneous mass within the mediastinum, abutting the right side of the aortic arch and the anterior surface of the trachea, measuring 3.3 x 3.5 cm, most consistent with a metastatic lymph  node. There is also enlarged right supraclavicular lymphadenopathy. 4. Aortic Atherosclerosis (ICD10-I70.0). Electronically Signed   By: Ulyses Jarred M.D.   On: 07/09/2019 00:28   CT CEREBRAL PERFUSION W CONTRAST  Result Date: 07/09/2019 CLINICAL DATA:  Acute onset aphasia EXAM: CT ANGIOGRAPHY HEAD AND NECK CT PERFUSION BRAIN TECHNIQUE: Multidetector CT imaging of the head and neck was performed using the standard protocol during bolus administration of intravenous contrast. Multiplanar CT image reconstructions and MIPs were obtained to evaluate the vascular anatomy. Carotid stenosis measurements (when applicable) are obtained utilizing NASCET criteria, using the distal internal carotid diameter as the denominator. Multiphase CT imaging of the brain was performed following IV bolus contrast injection. Subsequent parametric perfusion maps were calculated using RAPID software. CONTRAST:  120mL OMNIPAQUE IOHEXOL 350 MG/ML SOLN COMPARISON:  None. FINDINGS: CT HEAD FINDINGS Brain: There is no mass, hemorrhage or extra-axial collection. There is generalized atrophy without lobar predilection. There is no acute or chronic infarction. There is hypoattenuation of the periventricular white matter, most commonly indicating chronic ischemic microangiopathy. Skull: The visualized skull base, calvarium and extracranial soft tissues are normal. Sinuses/Orbits: No fluid levels or advanced mucosal thickening of the visualized paranasal sinuses. Partial right mastoid opacification. No middle ear effusion. The orbits are normal. CTA NECK FINDINGS SKELETON: There is no bony spinal canal stenosis. No lytic or blastic lesion. OTHER NECK: Heterogeneous mass within the mediastinum, abutting the right side of the aortic arch and the anterior surface of the trachea, measures 3.3 x 3.5 cm (series 3, image 9). UPPER CHEST: No pneumothorax or pleural effusion. No nodules or masses. AORTIC ARCH: There is mild calcific atherosclerosis of the  aortic arch. There is no aneurysm, dissection or hemodynamically significant stenosis of the visualized portion of the aorta. Conventional 3 vessel aortic branching  pattern. The visualized proximal subclavian arteries are widely patent. RIGHT CAROTID SYSTEM: No dissection, occlusion or aneurysm. There is mixed density atherosclerosis extending into the proximal ICA, resulting in less than 50% stenosis. LEFT CAROTID SYSTEM: No dissection, occlusion or aneurysm. There is mixed density atherosclerosis extending into the proximal ICA, resulting in less than 50% stenosis. VERTEBRAL ARTERIES: Left dominant configuration. Atherosclerotic calcification narrows the right vertebral artery origin. There is no dissection, occlusion or flow-limiting stenosis to the skull base (V1-V3 segments). CTA HEAD FINDINGS POSTERIOR CIRCULATION: --Vertebral arteries: Normal V4 segments. --Posterior inferior cerebellar arteries (PICA): Patent origins from the vertebral arteries. --Anterior inferior cerebellar arteries (AICA): Patent origins from the basilar artery. --Basilar artery: Normal. --Superior cerebellar arteries: Normal. --Posterior cerebral arteries: Normal. Both originate from the basilar artery. Posterior communicating arteries (p-comm) are diminutive or absent. ANTERIOR CIRCULATION: --Intracranial internal carotid arteries: Atherosclerotic calcification of the internal carotid arteries at the skull base without hemodynamically significant stenosis. --Anterior cerebral arteries (ACA): Normal. Both A1 segments are present. Patent anterior communicating artery (a-comm). --Middle cerebral arteries (MCA): Normal. VENOUS SINUSES: As permitted by contrast timing, patent. ANATOMIC VARIANTS: None Review of the MIP images confirms the above findings. CT Brain Perfusion Findings: ASPECTS: 10 CBF (<30%) Volume: 49mL Perfusion (Tmax>6.0s) volume: 80mL Mismatch Volume: 48mL Infarction Location:None IMPRESSION: 1. No emergent large vessel  occlusion or high-grade stenosis of the intracranial arteries. 2. Normal perfusion examination. 3. Heterogeneous mass within the mediastinum, abutting the right side of the aortic arch and the anterior surface of the trachea, measuring 3.3 x 3.5 cm, most consistent with a metastatic lymph node. There is also enlarged right supraclavicular lymphadenopathy. 4. Aortic Atherosclerosis (ICD10-I70.0). Electronically Signed   By: Ulyses Jarred M.D.   On: 07/09/2019 00:28   CT HEAD CODE STROKE WO CONTRAST`  Result Date: 07/08/2019 CLINICAL DATA:  Code stroke.  Altered mental status EXAM: CT HEAD WITHOUT CONTRAST TECHNIQUE: Contiguous axial images were obtained from the base of the skull through the vertex without intravenous contrast. COMPARISON:  CT head 12/10/2010 FINDINGS: Brain: Moderate atrophy, with progression in degree. Negative for hydrocephalus. Mild white matter changes compatible with chronic microvascular ischemia. Negative for acute infarct, hemorrhage, mass. Vascular: Negative for hyperdense vessel Skull: Negative Sinuses/Orbits: Paranasal sinuses clear. Right mastoid effusion. Left mastoid clear. Negative orbit. Other: None ASPECTS ([place] Stroke Program Early CT Score) - Ganglionic level infarction (caudate, lentiform nuclei, internal capsule, insula, M1-M3 cortex): 7 - Supraganglionic infarction (M4-M6 cortex): 3 Total score (0-10 with 10 being normal): 10 IMPRESSION: 1. Moderate atrophy.  No acute abnormality. 2. ASPECTS is 10 3. These results were called by telephone at the time of interpretation on 07/08/2019 at 8:07 pm to provider [facility] , who verbally acknowledged these results. Electronically Signed   By: Franchot Gallo M.D.   On: 07/08/2019 20:07    Scheduled Meds: . aspirin  81 mg Oral Daily  . atorvastatin  20 mg Oral Daily  . carbidopa-levodopa  2.5 tablet Oral TID  . enoxaparin (LOVENOX) injection  40 mg Subcutaneous Q24H  . levothyroxine  50 mcg Oral Daily  . metoprolol  succinate  25 mg Oral Daily  . pantoprazole  40 mg Oral Daily   Continuous Infusions: . sodium chloride 75 mL/hr at 07/09/19 0700  . cefTRIAXone (ROCEPHIN)  IV      Assessment/Plan:  1. Acute delirium.  This seems to have improved.  Patient needed numerous medications to get into the CAT scanner last night.  Patient able to follow commands.  I  will put in for as needed Seroquel at night if she is unable to sleep or is agitated. 2. Acute cystitis without hematuria.  Follow-up urine culture.  Patient on IV Rocephin. 3. Parkinson's disease on Sinemet.  Try to avoid Haldol if agitated. 4. Paroxysmal atrial fibrillation on aspirin, metoprolol. 5. Hyperlipidemia unspecified on atorvastatin 6. Hypothyroidism unspecified on levothyroxine 7. 3.3 x 3.5 cm mass in the mediastinum which could be a metastatic lymph node.  I will get a CT scan of the chest abdomen pelvis for further evaluation 8. Weakness.  Physical therapy evaluation.  Patient does have quite a bit of help at home already. 9. Patient on dysphagia 1 diet with thin liquids  Code Status:     Code Status Orders  (From admission, onward)         Start     Ordered   07/09/19 0134  Do not attempt resuscitation (DNR)  Continuous    Question Answer Comment  In the event of cardiac or respiratory ARREST Do not call a "code blue"   In the event of cardiac or respiratory ARREST Do not perform Intubation, CPR, defibrillation or ACLS   In the event of cardiac or respiratory ARREST Use medication by any route, position, wound care, and other measures to relive pain and suffering. May use oxygen, suction and manual treatment of airway obstruction as needed for comfort.      07/09/19 0133        Code Status History    Date Active Date Inactive Code Status Order ID Comments User Context   07/09/2019 0133 07/09/2019 0133 Full Code [id number]  Gwynne Edinger, MD Inpatient   05/11/2018 1534 05/13/2018 1950 Full Code [id number]  [name], MD Inpatient   05/10/2018 1944 05/11/2018 1534 DNR [id number]  Bettey Costa, MD ED   05/04/2018 1824 05/05/2018 1508 Full Code [id number]  Henreitta Leber, MD ED   Advance Care Planning Activity     Family Communication: Spoke with the patient's son on the phone Disposition Plan: Since the patient came in with altered mental status secondary to acute cystitis I would like to have the urine culture results and have the correct antibiotic prior to disposition.  Urine culture still pending.  We will also get a CT scan of the chest abdomen and pelvis to see if there is anything further going on that could be causing that large mediastinal lymph node.  Hopefully will need a couple more days here in the hospital.  Consultants:  Speech therapy  Antibiotics:  Rocephin  Time spent: 28 minutes  [place]

## 2019-07-09 NOTE — Progress Notes (Signed)
Late entering received pt from ED around [place] to room 8, pt will open eyes to speech, pupiles are equal and reactive to light. Pt withdraws to pain on all four extremities. Pt will attempt to Mumbling words that are incomprehensible. Will not follow any purposeful command, noted tremors, per note pt has his of Parkinson. VSS.

## 2019-07-09 NOTE — Progress Notes (Signed)
Pt MEWS score was 7 to 6 increase BP, NP was notified and BP was treated with labetalol IV and current MEWS is 3, pt opening her eyes more, following simple command like open your eyes, Open your mouth. Pt tracking staff in the room. Attempt to grip weakly bilat equal strength noted.

## 2019-07-10 ENCOUNTER — Inpatient Hospital Stay: Payer: Medicare PPO

## 2019-07-10 DIAGNOSIS — R591 Generalized enlarged lymph nodes: Secondary | ICD-10-CM

## 2019-07-10 MED ORDER — IOHEXOL 300 MG/ML  SOLN
100.0000 mL | Freq: Once | INTRAMUSCULAR | Status: AC | PRN
  Administered 2019-07-10: 04:00:00 100 mL via INTRAVENOUS

## 2019-07-10 MED ORDER — SODIUM CHLORIDE 0.9% FLUSH
10.0000 mL | Freq: Two times a day (BID) | INTRAVENOUS | Status: DC
  Administered 2019-07-10 – 2019-07-12 (×2): 10 mL

## 2019-07-10 MED ORDER — QUETIAPINE FUMARATE 25 MG PO TABS: 25.0000 mg | ORAL_TABLET | Freq: Every evening | ORAL | Status: DC | PRN

## 2019-07-10 MED ORDER — SODIUM CHLORIDE 0.9% FLUSH: 10.0000 mL | INTRAVENOUS | Status: DC | PRN

## 2019-07-10 NOTE — NC FL2 (Signed)
[place] LEVEL OF CARE SCREENING TOOL     IDENTIFICATION  Patient Name: Olivia [name] Birthdate: 05-13-1925 Sex: female Admission Date (Current Location): 07/08/2019  [place] and [place] Number:  Engineering geologist and Address:  [facility], [address] [place], [place] [address]      Provider Number: [id number]  Attending Physician Name and Address:  Loletha Grayer, MD  Relative Name and Phone Number:       Current Level of Care: Hospital Recommended Level of Care: [place] Prior Approval Number:    Date Approved/Denied:   PASRR Number:    Discharge Plan: SNF    Current Diagnoses: Patient Active Problem List   Diagnosis Date Noted  . Acute delirium   . Acute cystitis without hematuria   . Mediastinal mass   . Weakness   . Acute metabolic encephalopathy 30/86/5784  . Parkinson disease ([place]) 07/08/2019  . AF (paroxysmal atrial fibrillation) ([place]) 07/08/2019  . Complete heart block ([place]) 07/08/2019  . History of GI bleed 07/08/2019  . Chronic diastolic CHF (congestive heart failure) ([place]) 07/08/2019  . GIB (gastrointestinal bleeding) 05/10/2018  . SVT (supraventricular tachycardia) ([place]) 05/04/2018  . SCC (squamous cell carcinoma), face 06/19/2016  . Medicare annual wellness visit, initial 02/11/2016  . Hypothyroidism 08/09/2015  . Benign essential hypertension 08/09/2015  . Hyperlipidemia, mixed 08/09/2015  . Aortic stenosis, moderate 02/07/2015    Orientation RESPIRATION BLADDER Height & Weight     Self  Normal Incontinent, Indwelling catheter Weight: 156 lb 8.4 oz (71 kg) Height:  5\' 6"  (167.6 cm)  BEHAVIORAL SYMPTOMS/MOOD NEUROLOGICAL BOWEL NUTRITION STATUS      Continent    AMBULATORY STATUS COMMUNICATION OF NEEDS Skin   Extensive Assist Verbally Normal                       Personal Care Assistance Level of Assistance  Bathing, Dressing, Feeding Bathing Assistance: Limited  assistance Feeding assistance: Limited assistance Dressing Assistance: Limited assistance     Functional Limitations Info  Sight, Hearing, Speech Sight Info: Impaired Hearing Info: Impaired Speech Info: Impaired    SPECIAL CARE FACTORS FREQUENCY  PT (By licensed PT), OT (By licensed OT)                    Contractures Contractures Info: Not present    Additional Factors Info  Code Status Code Status Info: DNR             Current Medications (07/10/2019):  This is the current hospital active medication list Current Facility-Administered Medications  Medication Dose Route Frequency Provider Last Rate Last Admin  . 0.9 %  sodium chloride infusion   Intravenous Continuous Gwynne Edinger, MD 75 mL/hr at 07/09/19 1800 Rate Verify at 07/09/19 1800  . acetaminophen (TYLENOL) tablet 650 mg  650 mg Oral Q4H PRN Wouk, Ailene Rud, MD       Or  . acetaminophen (TYLENOL) 160 MG/5ML solution 650 mg  650 mg Per Tube Q4H PRN Wouk, Ailene Rud, MD       Or  . acetaminophen (TYLENOL) suppository 650 mg  650 mg Rectal Q4H PRN Wouk, Ailene Rud, MD      . albuterol (VENTOLIN HFA) 108 (90 Base) MCG/ACT inhaler 2 puff  2 puff Inhalation Q6H PRN Wouk, Ailene Rud, MD      . aspirin chewable tablet 81 mg  81 mg Oral Daily Wouk, Ailene Rud, MD   [phone]  mg at 07/10/19 0925  . atorvastatin (LIPITOR) tablet 20 mg  20 mg Oral Daily Gwynne Edinger, MD   20 mg at 07/10/19 0925  . carbidopa-levodopa (SINEMET IR) 25-100 MG per tablet immediate release 2.5 tablet  2.5 tablet Oral TID Gwynne Edinger, MD   2.5 tablet at 07/10/19 0927  . cefTRIAXone (ROCEPHIN) 1 g in sodium chloride 0.9 % 100 mL IVPB  1 g Intravenous Q24H Gwynne Edinger, MD 200 mL/hr at 07/09/19 2030 1 g at 07/09/19 2030  . enoxaparin (LOVENOX) injection 40 mg  40 mg Subcutaneous Q24H Gwynne Edinger, MD   40 mg at 07/10/19 0926  . labetalol (NORMODYNE) injection 5 mg  5 mg Intravenous Q6H PRN Lang Snow,  NP      . levothyroxine (SYNTHROID) tablet 50 mcg  50 mcg Oral Daily Gwynne Edinger, MD   50 mcg at 07/09/19 [phone]  . metoprolol succinate (TOPROL-XL) 24 hr tablet 25 mg  25 mg Oral Daily Gwynne Edinger, MD   25 mg at 07/10/19 0925  . pantoprazole (PROTONIX) EC tablet 40 mg  40 mg Oral Daily Gwynne Edinger, MD   40 mg at 07/10/19 0925  . QUEtiapine (SEROQUEL) tablet 25 mg  25 mg Oral QHS Loletha Grayer, MD   25 mg at 07/09/19 2021  . risperiDONE (RISPERDAL) 1 MG/ML oral solution 1 mg  1 mg Oral BID Loletha Grayer, MD   1 mg at 07/10/19 0926  . senna-docusate (Senokot-S) tablet 1 tablet  1 tablet Oral QHS PRN Wouk, Ailene Rud, MD      . sodium chloride flush (NS) 0.9 % injection 10-40 mL  10-40 mL Intracatheter Q12H Wieting, Richard, MD      . sodium chloride flush (NS) 0.9 % injection 10-40 mL  10-40 mL Intracatheter PRN Loletha Grayer, MD         Discharge Medications: Please see discharge summary for a list of discharge medications.  Relevant Imaging Results:  Relevant Lab Results:   Additional Information SS# [id number]  [place], [place], [place]

## 2019-07-10 NOTE — Progress Notes (Signed)
Patient ID: Olivia [name], female   DOB: 31-May-1925, 86 y.o.   MRN: [id number] [facility] PROGRESS NOTE  Olivia [name] EGB:[id number] DOB: [birth year] [name]: 07/08/2019 PCP: Rusty Aus, MD  HPI/Subjective: Patient able to answer some questions today.  She was in the bed sideways when I saw her.  She states that she has been losing weight.  I had to reorient her that she was in the hospital.  As per son she has been having some hallucinations visual.  Objective: Vitals:   07/10/19 0200 07/10/19 0829  BP:  (!) 152/89  Pulse: 97 99  Resp: 18 16  Temp:  98.8 F (37.1 C)  SpO2: 98% 96%    Intake/Output Summary (Last 24 hours) at 07/10/2019 1318 Last data filed at 07/10/2019 0400 Gross per 24 hour  Intake 1360.27 ml  Output 450 ml  Net 910.27 ml   Filed Weights   07/08/19 1934 07/09/19 0023  Weight: 71 kg 71 kg    ROS: Review of Systems  Unable to perform ROS: Acuity of condition  Constitutional: Positive for weight loss.  Respiratory: Negative for shortness of breath.   Cardiovascular: Negative for chest pain.  Gastrointestinal: Negative for abdominal pain, nausea and vomiting.  Musculoskeletal: Negative for joint pain.   Exam: Physical Exam  HENT:  Nose: No mucosal edema.  Mouth/Throat: No oropharyngeal exudate or posterior oropharyngeal edema.  Eyes: Pupils are equal, round, and reactive to light. Conjunctivae and lids are normal.  Neck: Carotid bruit is not present.  Cardiovascular: S1 normal and S2 normal. Exam reveals no gallop.  No murmur heard. Respiratory: No respiratory distress. She has decreased breath sounds in the right lower field and the left lower field. She has no wheezes. She has no rhonchi. She has no rales.  GI: Soft. Bowel sounds are normal. There is no abdominal tenderness.  Musculoskeletal:     Right ankle: No swelling.     Left ankle: No swelling.  Lymphadenopathy:    She has no cervical adenopathy.  Neurological: She is alert.  Patient  able to answer some questions for me and follow some simple commands  Skin: Skin is warm. No rash noted. Nails show no clubbing.  Psychiatric:  Answers some questions for me today and able to follow some simple commands      Data Reviewed: Basic Metabolic Panel: Recent Labs  Lab 07/08/19 1942 07/09/19 0259  NA 135  --   K 4.0  --   CL 100  --   CO2 24  --   GLUCOSE 106*  --   BUN 18  --   CREATININE 0.92 0.84  CALCIUM 9.8  --    Liver Function Tests: Recent Labs  Lab 07/08/19 1942  AST 31  ALT 6  ALKPHOS 147*  BILITOT 1.1  PROT 6.5  ALBUMIN 4.1    Recent Labs  Lab 07/09/19 0605  AMMONIA 13   CBC: Recent Labs  Lab 07/08/19 1942 07/09/19 0259  WBC 7.4 10.2  HGB 10.6* 11.7*  HCT 31.3* 34.0*  MCV 97.5 95.8  PLT 504* 548*     Recent Results (from the past 240 hour(s))  Urine Culture     Status: Abnormal (Preliminary result)   Collection Time: 07/08/19  9:02 PM   Specimen: Urine, Random  Result Value Ref Range Status   Specimen Description   Final    URINE, RANDOM Performed at [facility] [address], [place],  [address]    Special  Requests   Final    NONE Performed at [facility], [address] [place], [place] [address]    Culture >=100,000 COLONIES/mL ESCHERICHIA COLI (A)  Final   Report Status PENDING  Incomplete  Respiratory Panel by RT PCR (Flu A&B, Covid) - Nasopharyngeal Swab     Status: None   Collection Time: 07/08/19 11:25 PM   Specimen: Nasopharyngeal Swab  Result Value Ref Range Status   SARS Coronavirus 2 by RT PCR NEGATIVE NEGATIVE Final    Comment: (NOTE) SARS-CoV-2 target nucleic acids are NOT DETECTED. The SARS-CoV-2 RNA is generally detectable in upper respiratoy specimens during the acute phase of infection. The lowest concentration of SARS-CoV-2 viral copies this assay can detect is 131 copies/mL. A negative result does not preclude SARS-Cov-2 infection and should not be used as the sole  basis for treatment or other patient management decisions. A negative result may occur with  improper specimen collection/handling, submission of specimen other than nasopharyngeal swab, presence of viral mutation(s) within the areas targeted by this assay, and inadequate number of viral copies (<131 copies/mL). A negative result must be combined with clinical observations, patient history, and epidemiological information. The expected result is Negative. Fact Sheet for Patients:  PinkCheek.be Fact Sheet for Healthcare Providers:  GravelBags.it This test is not yet ap proved or cleared by the [place] FDA and  has been authorized for detection and/or diagnosis of SARS-CoV-2 by FDA under an Emergency Use Authorization (EUA). This EUA will remain  in effect (meaning this test can be used) for the duration of the COVID-19 declaration under Section 564(b)(1) of the Act, 21 U.S.C. section 360bbb-3(b)(1), unless the authorization is terminated or revoked sooner.    Influenza A by PCR NEGATIVE NEGATIVE Final   Influenza B by PCR NEGATIVE NEGATIVE Final    Comment: (NOTE) The Xpert Xpress SARS-CoV-2/FLU/RSV assay is intended as an aid in  the diagnosis of influenza from Nasopharyngeal swab specimens and  should not be used as a sole basis for treatment. Nasal washings and  aspirates are unacceptable for Xpert Xpress SARS-CoV-2/FLU/RSV  testing. Fact Sheet for Patients: PinkCheek.be Fact Sheet for Healthcare Providers: GravelBags.it This test is not yet approved or cleared by the [place] FDA and  has been authorized for detection and/or diagnosis of SARS-CoV-2 by  FDA under an Emergency Use Authorization (EUA). This EUA will remain  in effect (meaning this test can be used) for the duration of the  Covid-19 declaration under Section 564(b)(1) of the Act, 21  U.S.C.  section 360bbb-3(b)(1), unless the authorization is  terminated or revoked. Performed at [facility], [address] [place], [place] [address]      Studies: CT ANGIO HEAD W OR WO CONTRAST  Result Date: 07/09/2019 CLINICAL DATA:  Acute onset aphasia EXAM: CT ANGIOGRAPHY HEAD AND NECK CT PERFUSION BRAIN TECHNIQUE: Multidetector CT imaging of the head and neck was performed using the standard protocol during bolus administration of intravenous contrast. Multiplanar CT image reconstructions and MIPs were obtained to evaluate the vascular anatomy. Carotid stenosis measurements (when applicable) are obtained utilizing NASCET criteria, using the distal internal carotid diameter as the denominator. Multiphase CT imaging of the brain was performed following IV bolus contrast injection. Subsequent parametric perfusion maps were calculated using RAPID software. CONTRAST:  130mL OMNIPAQUE IOHEXOL 350 MG/ML SOLN COMPARISON:  None. FINDINGS: CT HEAD FINDINGS Brain: There is no mass, hemorrhage or extra-axial collection. There is generalized atrophy without lobar predilection. There is no acute or  chronic infarction. There is hypoattenuation of the periventricular white matter, most commonly indicating chronic ischemic microangiopathy. Skull: The visualized skull base, calvarium and extracranial soft tissues are normal. Sinuses/Orbits: No fluid levels or advanced mucosal thickening of the visualized paranasal sinuses. Partial right mastoid opacification. No middle ear effusion. The orbits are normal. CTA NECK FINDINGS SKELETON: There is no bony spinal canal stenosis. No lytic or blastic lesion. OTHER NECK: Heterogeneous mass within the mediastinum, abutting the right side of the aortic arch and the anterior surface of the trachea, measures 3.3 x 3.5 cm (series 3, image 9). UPPER CHEST: No pneumothorax or pleural effusion. No nodules or masses. AORTIC ARCH: There is mild calcific atherosclerosis of the aortic  arch. There is no aneurysm, dissection or hemodynamically significant stenosis of the visualized portion of the aorta. Conventional 3 vessel aortic branching pattern. The visualized proximal subclavian arteries are widely patent. RIGHT CAROTID SYSTEM: No dissection, occlusion or aneurysm. There is mixed density atherosclerosis extending into the proximal ICA, resulting in less than 50% stenosis. LEFT CAROTID SYSTEM: No dissection, occlusion or aneurysm. There is mixed density atherosclerosis extending into the proximal ICA, resulting in less than 50% stenosis. VERTEBRAL ARTERIES: Left dominant configuration. Atherosclerotic calcification narrows the right vertebral artery origin. There is no dissection, occlusion or flow-limiting stenosis to the skull base (V1-V3 segments). CTA HEAD FINDINGS POSTERIOR CIRCULATION: --Vertebral arteries: Normal V4 segments. --Posterior inferior cerebellar arteries (PICA): Patent origins from the vertebral arteries. --Anterior inferior cerebellar arteries (AICA): Patent origins from the basilar artery. --Basilar artery: Normal. --Superior cerebellar arteries: Normal. --Posterior cerebral arteries: Normal. Both originate from the basilar artery. Posterior communicating arteries (p-comm) are diminutive or absent. ANTERIOR CIRCULATION: --Intracranial internal carotid arteries: Atherosclerotic calcification of the internal carotid arteries at the skull base without hemodynamically significant stenosis. --Anterior cerebral arteries (ACA): Normal. Both A1 segments are present. Patent anterior communicating artery (a-comm). --Middle cerebral arteries (MCA): Normal. VENOUS SINUSES: As permitted by contrast timing, patent. ANATOMIC VARIANTS: None Review of the MIP images confirms the above findings. CT Brain Perfusion Findings: ASPECTS: 10 CBF (<30%) Volume: 67mL Perfusion (Tmax>6.0s) volume: 42mL Mismatch Volume: 56mL Infarction Location:None IMPRESSION: 1. No emergent large vessel occlusion or  high-grade stenosis of the intracranial arteries. 2. Normal perfusion examination. 3. Heterogeneous mass within the mediastinum, abutting the right side of the aortic arch and the anterior surface of the trachea, measuring 3.3 x 3.5 cm, most consistent with a metastatic lymph node. There is also enlarged right supraclavicular lymphadenopathy. 4. Aortic Atherosclerosis (ICD10-I70.0). Electronically Signed   By: Ulyses Jarred M.D.   On: 07/09/2019 00:28   CT ANGIO NECK W OR WO CONTRAST  Result Date: 07/09/2019 CLINICAL DATA:  Acute onset aphasia EXAM: CT ANGIOGRAPHY HEAD AND NECK CT PERFUSION BRAIN TECHNIQUE: Multidetector CT imaging of the head and neck was performed using the standard protocol during bolus administration of intravenous contrast. Multiplanar CT image reconstructions and MIPs were obtained to evaluate the vascular anatomy. Carotid stenosis measurements (when applicable) are obtained utilizing NASCET criteria, using the distal internal carotid diameter as the denominator. Multiphase CT imaging of the brain was performed following IV bolus contrast injection. Subsequent parametric perfusion maps were calculated using RAPID software. CONTRAST:  118mL OMNIPAQUE IOHEXOL 350 MG/ML SOLN COMPARISON:  None. FINDINGS: CT HEAD FINDINGS Brain: There is no mass, hemorrhage or extra-axial collection. There is generalized atrophy without lobar predilection. There is no acute or chronic infarction. There is hypoattenuation of the periventricular white matter, most commonly indicating chronic ischemic microangiopathy. Skull: The visualized  skull base, calvarium and extracranial soft tissues are normal. Sinuses/Orbits: No fluid levels or advanced mucosal thickening of the visualized paranasal sinuses. Partial right mastoid opacification. No middle ear effusion. The orbits are normal. CTA NECK FINDINGS SKELETON: There is no bony spinal canal stenosis. No lytic or blastic lesion. OTHER NECK: Heterogeneous mass within  the mediastinum, abutting the right side of the aortic arch and the anterior surface of the trachea, measures 3.3 x 3.5 cm (series 3, image 9). UPPER CHEST: No pneumothorax or pleural effusion. No nodules or masses. AORTIC ARCH: There is mild calcific atherosclerosis of the aortic arch. There is no aneurysm, dissection or hemodynamically significant stenosis of the visualized portion of the aorta. Conventional 3 vessel aortic branching pattern. The visualized proximal subclavian arteries are widely patent. RIGHT CAROTID SYSTEM: No dissection, occlusion or aneurysm. There is mixed density atherosclerosis extending into the proximal ICA, resulting in less than 50% stenosis. LEFT CAROTID SYSTEM: No dissection, occlusion or aneurysm. There is mixed density atherosclerosis extending into the proximal ICA, resulting in less than 50% stenosis. VERTEBRAL ARTERIES: Left dominant configuration. Atherosclerotic calcification narrows the right vertebral artery origin. There is no dissection, occlusion or flow-limiting stenosis to the skull base (V1-V3 segments). CTA HEAD FINDINGS POSTERIOR CIRCULATION: --Vertebral arteries: Normal V4 segments. --Posterior inferior cerebellar arteries (PICA): Patent origins from the vertebral arteries. --Anterior inferior cerebellar arteries (AICA): Patent origins from the basilar artery. --Basilar artery: Normal. --Superior cerebellar arteries: Normal. --Posterior cerebral arteries: Normal. Both originate from the basilar artery. Posterior communicating arteries (p-comm) are diminutive or absent. ANTERIOR CIRCULATION: --Intracranial internal carotid arteries: Atherosclerotic calcification of the internal carotid arteries at the skull base without hemodynamically significant stenosis. --Anterior cerebral arteries (ACA): Normal. Both A1 segments are present. Patent anterior communicating artery (a-comm). --Middle cerebral arteries (MCA): Normal. VENOUS SINUSES: As permitted by contrast timing,  patent. ANATOMIC VARIANTS: None Review of the MIP images confirms the above findings. CT Brain Perfusion Findings: ASPECTS: 10 CBF (<30%) Volume: 3mL Perfusion (Tmax>6.0s) volume: 43mL Mismatch Volume: 29mL Infarction Location:None IMPRESSION: 1. No emergent large vessel occlusion or high-grade stenosis of the intracranial arteries. 2. Normal perfusion examination. 3. Heterogeneous mass within the mediastinum, abutting the right side of the aortic arch and the anterior surface of the trachea, measuring 3.3 x 3.5 cm, most consistent with a metastatic lymph node. There is also enlarged right supraclavicular lymphadenopathy. 4. Aortic Atherosclerosis (ICD10-I70.0). Electronically Signed   By: Ulyses Jarred M.D.   On: 07/09/2019 00:28   CT CHEST W CONTRAST  Result Date: 07/10/2019 CLINICAL DATA:  Inpatient. Mediastinal mass and right supraclavicular lymphadenopathy on neck CT angiogram from 1 day prior. EXAM: CT CHEST, ABDOMEN, AND PELVIS WITH CONTRAST TECHNIQUE: Multidetector CT imaging of the chest, abdomen and pelvis was performed following the standard protocol during bolus administration of intravenous contrast. CONTRAST:  169mL OMNIPAQUE IOHEXOL 300 MG/ML  SOLN COMPARISON:  07/09/2019 neck CT angiogram. FINDINGS: CT CHEST FINDINGS Cardiovascular: Top-normal heart size. 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 Two lead left subclavian pacemaker with lead tips in the right atrium and right ventricular apex. No significant pericardial effusion/thickening. Left anterior descending coronary atherosclerosis. Atherosclerotic nonaneurysmal thoracic aorta. Top-normal caliber main pulmonary artery (3.1 cm diameter). No central pulmonary emboli. Mediastinum/Nodes: No discrete thyroid nodules. Unremarkable esophagus. No axillary adenopathy. Enlarged 2.3 cm right supraclavicular node (series 2/image 7). Bulky right paratracheal adenopathy up to the 3.2 cm (series 2/image 20). Multiple mildly enlarged bilateral prevascular lymph  nodes, largest 1.2 cm on the right (series 2/image 21). Enlarged 1.1  cm subcarinal node (series 2/image 29). Enlarged 1.2 cm right hilar node (series 2/image 30). No left hilar adenopathy. Lungs/Pleura: No pneumothorax. No pleural effusion. Mild passive atelectasis in the dependent lower lobes bilaterally. No acute consolidative airspace disease or lung masses. Apical left upper lobe 4 mm solid pulmonary nodule (series 4/image 25). No additional significant pulmonary nodules. Musculoskeletal: No aggressive appearing focal osseous lesions. Mild thoracic spondylosis. CT ABDOMEN PELVIS FINDINGS Hepatobiliary: Normal liver size. Subcentimeter hypervascular left liver focus (series 2/image 49). No additional liver lesions. Cholecystectomy. No biliary ductal dilatation. Pancreas: Normal, with no mass or duct dilation. Spleen: Normal size spleen. Ill-defined low-attenuation lesions throughout the spleen, largest 2.3 cm (series 2/image 61). Adrenals/Urinary Tract: Normal adrenals. Subcentimeter left renal cortical lesions are too small to characterize and require no follow-up. Otherwise normal kidneys, with no hydronephrosis. Excreted contrast in the bladder from yesterday's CT study. Mild bladder wall trabeculation. Stomach/Bowel: Small hiatal hernia. Otherwise normal nondistended stomach. Normal caliber small bowel with no small bowel wall thickening. Appendix not discretely visualized. Marked diffuse colonic diverticulosis, with no large bowel wall thickening or significant pericolonic fat stranding. Vascular/Lymphatic: Atherosclerotic nonaneurysmal abdominal aorta. Patent portal, splenic, hepatic and renal veins. Enlarged 1.8 cm portacaval node (series 2/image 62). Enlarged 1.4 cm gastrohepatic ligament node (series 2/image 58). No additional pathologically enlarged abdominopelvic nodes. Reproductive: No adnexal masses. Mildly enlarged and presumably myomatous uterus. Other: No pneumoperitoneum, ascites or focal fluid  collection. Musculoskeletal: No aggressive appearing focal osseous lesions. Moderate lumbar spondylosis. IMPRESSION: 1. Right supraclavicular, right paratracheal, prevascular, subcarinal, right hilar, gastrohepatic ligament and portacaval lymphadenopathy. Several ill-defined low-attenuation splenic lesions. Findings are most suggestive of lymphoma. Metastatic adenopathy of unknown primary site is on the differential. The right supraclavicular node may be accessible for percutaneous biopsy. 2. Solitary 4 mm left upper lobe pulmonary nodule. Recommend attention on follow-up chest CT in 3 months. 3. Nonspecific subcentimeter hypervascular left liver focus. Depending on biopsy results, follow-up MRI abdomen without and with IV contrast could be obtained for further evaluation in 3 months. 4. Chronic findings include: Marked diffuse colonic diverticulosis. Small hiatal hernia. Mildly enlarged and presumably myomatous uterus. Aortic Atherosclerosis (ICD10-I70.0). Electronically Signed   By: Ilona Sorrel M.D.   On: 07/10/2019 08:43   CT ABDOMEN PELVIS W CONTRAST  Result Date: 07/10/2019 CLINICAL DATA:  Inpatient. Mediastinal mass and right supraclavicular lymphadenopathy on neck CT angiogram from 1 day prior. EXAM: CT CHEST, ABDOMEN, AND PELVIS WITH CONTRAST TECHNIQUE: Multidetector CT imaging of the chest, abdomen and pelvis was performed following the standard protocol during bolus administration of intravenous contrast. CONTRAST:  176mL OMNIPAQUE IOHEXOL 300 MG/ML  SOLN COMPARISON:  07/09/2019 neck CT angiogram. FINDINGS: CT CHEST FINDINGS Cardiovascular: Top-normal heart size. 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 Two lead left subclavian pacemaker with lead tips in the right atrium and right ventricular apex. No significant pericardial effusion/thickening. Left anterior descending coronary atherosclerosis. Atherosclerotic nonaneurysmal thoracic aorta. Top-normal caliber main pulmonary artery (3.1 cm diameter). No  central pulmonary emboli. Mediastinum/Nodes: No discrete thyroid nodules. Unremarkable esophagus. No axillary adenopathy. Enlarged 2.3 cm right supraclavicular node (series 2/image 7). Bulky right paratracheal adenopathy up to the 3.2 cm (series 2/image 20). Multiple mildly enlarged bilateral prevascular lymph nodes, largest 1.2 cm on the right (series 2/image 21). Enlarged 1.1 cm subcarinal node (series 2/image 29). Enlarged 1.2 cm right hilar node (series 2/image 30). No left hilar adenopathy. Lungs/Pleura: No pneumothorax. No pleural effusion. Mild passive atelectasis in the dependent lower lobes bilaterally. No acute consolidative airspace disease or  lung masses. Apical left upper lobe 4 mm solid pulmonary nodule (series 4/image 25). No additional significant pulmonary nodules. Musculoskeletal: No aggressive appearing focal osseous lesions. Mild thoracic spondylosis. CT ABDOMEN PELVIS FINDINGS Hepatobiliary: Normal liver size. Subcentimeter hypervascular left liver focus (series 2/image 49). No additional liver lesions. Cholecystectomy. No biliary ductal dilatation. Pancreas: Normal, with no mass or duct dilation. Spleen: Normal size spleen. Ill-defined low-attenuation lesions throughout the spleen, largest 2.3 cm (series 2/image 61). Adrenals/Urinary Tract: Normal adrenals. Subcentimeter left renal cortical lesions are too small to characterize and require no follow-up. Otherwise normal kidneys, with no hydronephrosis. Excreted contrast in the bladder from yesterday's CT study. Mild bladder wall trabeculation. Stomach/Bowel: Small hiatal hernia. Otherwise normal nondistended stomach. Normal caliber small bowel with no small bowel wall thickening. Appendix not discretely visualized. Marked diffuse colonic diverticulosis, with no large bowel wall thickening or significant pericolonic fat stranding. Vascular/Lymphatic: Atherosclerotic nonaneurysmal abdominal aorta. Patent portal, splenic, hepatic and renal veins.  Enlarged 1.8 cm portacaval node (series 2/image 62). Enlarged 1.4 cm gastrohepatic ligament node (series 2/image 58). No additional pathologically enlarged abdominopelvic nodes. Reproductive: No adnexal masses. Mildly enlarged and presumably myomatous uterus. Other: No pneumoperitoneum, ascites or focal fluid collection. Musculoskeletal: No aggressive appearing focal osseous lesions. Moderate lumbar spondylosis. IMPRESSION: 1. Right supraclavicular, right paratracheal, prevascular, subcarinal, right hilar, gastrohepatic ligament and portacaval lymphadenopathy. Several ill-defined low-attenuation splenic lesions. Findings are most suggestive of lymphoma. Metastatic adenopathy of unknown primary site is on the differential. The right supraclavicular node may be accessible for percutaneous biopsy. 2. Solitary 4 mm left upper lobe pulmonary nodule. Recommend attention on follow-up chest CT in 3 months. 3. Nonspecific subcentimeter hypervascular left liver focus. Depending on biopsy results, follow-up MRI abdomen without and with IV contrast could be obtained for further evaluation in 3 months. 4. Chronic findings include: Marked diffuse colonic diverticulosis. Small hiatal hernia. Mildly enlarged and presumably myomatous uterus. Aortic Atherosclerosis (ICD10-I70.0). Electronically Signed   By: Ilona Sorrel M.D.   On: 07/10/2019 08:43   CT CEREBRAL PERFUSION W CONTRAST  Result Date: 07/09/2019 CLINICAL DATA:  Acute onset aphasia EXAM: CT ANGIOGRAPHY HEAD AND NECK CT PERFUSION BRAIN TECHNIQUE: Multidetector CT imaging of the head and neck was performed using the standard protocol during bolus administration of intravenous contrast. Multiplanar CT image reconstructions and MIPs were obtained to evaluate the vascular anatomy. Carotid stenosis measurements (when applicable) are obtained utilizing NASCET criteria, using the distal internal carotid diameter as the denominator. Multiphase CT imaging of the brain was  performed following IV bolus contrast injection. Subsequent parametric perfusion maps were calculated using RAPID software. CONTRAST:  171mL OMNIPAQUE IOHEXOL 350 MG/ML SOLN COMPARISON:  None. FINDINGS: CT HEAD FINDINGS Brain: There is no mass, hemorrhage or extra-axial collection. There is generalized atrophy without lobar predilection. There is no acute or chronic infarction. There is hypoattenuation of the periventricular white matter, most commonly indicating chronic ischemic microangiopathy. Skull: The visualized skull base, calvarium and extracranial soft tissues are normal. Sinuses/Orbits: No fluid levels or advanced mucosal thickening of the visualized paranasal sinuses. Partial right mastoid opacification. No middle ear effusion. The orbits are normal. CTA NECK FINDINGS SKELETON: There is no bony spinal canal stenosis. No lytic or blastic lesion. OTHER NECK: Heterogeneous mass within the mediastinum, abutting the right side of the aortic arch and the anterior surface of the trachea, measures 3.3 x 3.5 cm (series 3, image 9). UPPER CHEST: No pneumothorax or pleural effusion. No nodules or masses. AORTIC ARCH: There is mild calcific atherosclerosis of the  aortic arch. There is no aneurysm, dissection or hemodynamically significant stenosis of the visualized portion of the aorta. Conventional 3 vessel aortic branching pattern. The visualized proximal subclavian arteries are widely patent. RIGHT CAROTID SYSTEM: No dissection, occlusion or aneurysm. There is mixed density atherosclerosis extending into the proximal ICA, resulting in less than 50% stenosis. LEFT CAROTID SYSTEM: No dissection, occlusion or aneurysm. There is mixed density atherosclerosis extending into the proximal ICA, resulting in less than 50% stenosis. VERTEBRAL ARTERIES: Left dominant configuration. Atherosclerotic calcification narrows the right vertebral artery origin. There is no dissection, occlusion or flow-limiting stenosis to the skull  base (V1-V3 segments). CTA HEAD FINDINGS POSTERIOR CIRCULATION: --Vertebral arteries: Normal V4 segments. --Posterior inferior cerebellar arteries (PICA): Patent origins from the vertebral arteries. --Anterior inferior cerebellar arteries (AICA): Patent origins from the basilar artery. --Basilar artery: Normal. --Superior cerebellar arteries: Normal. --Posterior cerebral arteries: Normal. Both originate from the basilar artery. Posterior communicating arteries (p-comm) are diminutive or absent. ANTERIOR CIRCULATION: --Intracranial internal carotid arteries: Atherosclerotic calcification of the internal carotid arteries at the skull base without hemodynamically significant stenosis. --Anterior cerebral arteries (ACA): Normal. Both A1 segments are present. Patent anterior communicating artery (a-comm). --Middle cerebral arteries (MCA): Normal. VENOUS SINUSES: As permitted by contrast timing, patent. ANATOMIC VARIANTS: None Review of the MIP images confirms the above findings. CT Brain Perfusion Findings: ASPECTS: 10 CBF (<30%) Volume: 61mL Perfusion (Tmax>6.0s) volume: 65mL Mismatch Volume: 27mL Infarction Location:None IMPRESSION: 1. No emergent large vessel occlusion or high-grade stenosis of the intracranial arteries. 2. Normal perfusion examination. 3. Heterogeneous mass within the mediastinum, abutting the right side of the aortic arch and the anterior surface of the trachea, measuring 3.3 x 3.5 cm, most consistent with a metastatic lymph node. There is also enlarged right supraclavicular lymphadenopathy. 4. Aortic Atherosclerosis (ICD10-I70.0). Electronically Signed   By: Ulyses Jarred M.D.   On: 07/09/2019 00:28   CT HEAD CODE STROKE WO CONTRAST`  Result Date: 07/08/2019 CLINICAL DATA:  Code stroke.  Altered mental status EXAM: CT HEAD WITHOUT CONTRAST TECHNIQUE: Contiguous axial images were obtained from the base of the skull through the vertex without intravenous contrast. COMPARISON:  CT head 12/10/2010  FINDINGS: Brain: Moderate atrophy, with progression in degree. Negative for hydrocephalus. Mild white matter changes compatible with chronic microvascular ischemia. Negative for acute infarct, hemorrhage, mass. Vascular: Negative for hyperdense vessel Skull: Negative Sinuses/Orbits: Paranasal sinuses clear. Right mastoid effusion. Left mastoid clear. Negative orbit. Other: None ASPECTS (Romney Stroke Program Early CT Score) - Ganglionic level infarction (caudate, lentiform nuclei, internal capsule, insula, M1-M3 cortex): 7 - Supraganglionic infarction (M4-M6 cortex): 3 Total score (0-10 with 10 being normal): 10 IMPRESSION: 1. Moderate atrophy.  No acute abnormality. 2. ASPECTS is 10 3. These results were called by telephone at the time of interpretation on 07/08/2019 at 8:07 pm to provider [facility] , who verbally acknowledged these results. Electronically Signed   By: Franchot Gallo M.D.   On: 07/08/2019 20:07    Scheduled Meds: . aspirin  81 mg Oral Daily  . atorvastatin  20 mg Oral Daily  . carbidopa-levodopa  2.5 tablet Oral TID  . enoxaparin (LOVENOX) injection  40 mg Subcutaneous Q24H  . levothyroxine  50 mcg Oral Daily  . metoprolol succinate  25 mg Oral Daily  . pantoprazole  40 mg Oral Daily  . QUEtiapine  25 mg Oral QHS  . risperiDONE  1 mg Oral BID  . sodium chloride flush  10-40 mL Intracatheter Q12H   Continuous Infusions: . sodium  chloride 30 mL/hr at 07/10/19 1239  . cefTRIAXone (ROCEPHIN)  IV 1 g (07/09/19 2030)    Assessment/Plan:  1. Acute delirium.  This needs to improve prior to disposition.  This morning I was able to talk with her and she was able to follow some simple commands.  Yesterday afternoon did get worse with the nursing staff with confusion.  As per the son having hallucinations now.  We will give Risperdal during the day and Seroquel at night.  With Parkinson patients I do not like to give Haldol. 2. lymphadenopathy in the neck, chest and abdomen.   Suspected lymphoma.  At this point son wanted to hold off on any biopsy with her mental status being where it is. They may not want any treatment moving forward anyway. 3. Acute cystitis without hematuria.  Follow-up urine culture.  Patient on IV Rocephin. 4. Parkinson's disease on Sinemet.  Try to avoid Haldol if agitated. 5. Paroxysmal atrial fibrillation on aspirin, metoprolol. 6. Hyperlipidemia unspecified on atorvastatin 7. Hypothyroidism unspecified on levothyroxine 8. Weakness.  Physical therapy evaluation.  Patient does have quite a bit of help at home already. 9. Patient on dysphagia 1 diet with thin liquids  Code Status:     Code Status Orders  (From admission, onward)         Start     Ordered   07/09/19 0134  Do not attempt resuscitation (DNR)  Continuous    Question Answer Comment  In the event of cardiac or respiratory ARREST Do not call a "code blue"   In the event of cardiac or respiratory ARREST Do not perform Intubation, CPR, defibrillation or ACLS   In the event of cardiac or respiratory ARREST Use medication by any route, position, wound care, and other measures to relive pain and suffering. May use oxygen, suction and manual treatment of airway obstruction as needed for comfort.      07/09/19 0133        Code Status History    Date Active Date Inactive Code Status Order ID Comments User Context   07/09/2019 0133 07/09/2019 0133 Full Code [id number]  Gwynne Edinger, MD Inpatient   05/11/2018 1534 05/13/2018 1950 Full Code [id number]  [name], MD Inpatient   05/10/2018 1944 05/11/2018 1534 DNR [id number]  Bettey Costa, MD ED   05/04/2018 1824 05/05/2018 1508 Full Code [id number]  Henreitta Leber, MD ED   Advance Care Planning Activity     Family Communication: Spoke with the patient's son on the phone Disposition Plan: Acute delirium will need to clear prior to disposition.  The patient likely has a lymphoma.  The goal is to go home and the patient will  likely need 24/7 care.  We will get palliative care consultation and likely palliative versus hospice at home.  Consultants:  Speech therapy  We will get palliative care consultation on Monday  Antibiotics:  Rocephin  Time spent: 27 minutes  [place]

## 2019-07-10 NOTE — TOC Initial Note (Signed)
Transition of Care [facility]) - Initial/Assessment Note    Patient Details  Name: Olivia [name] MRN: [id number] Date of Birth: Oct 18, 1925  Transition [facility] Care [facility]) CM/SW Contact:    Amador Cunas, [place] Phone Number: 07/10/2019, 9:35 AM  Clinical Narrative:   SW spoke with pt's son, Dr. Shellia Cleverly [phone] re PT recommendation for SNF. Pt from ILF at the [facility] with privately arranged caregivers. Pt's son anticipates need for SNF at [facility]), but unsure STR vs LTC. Will send information to [facility] and start auth with [employer]. Will f/u with son Monday.   Wandra Feinstein, MSW, LCSW [phone] (coverage)                Expected Discharge Plan: [place] Barriers to Discharge: Continued Medical Work up, Ship broker   Patient Goals and CMS Choice     Choice offered to / list presented to : NA  Expected Discharge Plan and Services Expected Discharge Plan: [place] Choice: [place] Living arrangements for the past 2 months: [place]                                      Prior Living Arrangements/Services Living arrangements for the past 2 months: [place] Lives with:: Self Patient language and need for interpreter reviewed:: No        Need for Family Participation in Patient Care: Yes (Comment) Care giver support system in place?: Yes (comment) Current home services: [place] Criminal Activity/Legal Involvement Pertinent to Current Situation/Hospitalization: No - Comment as needed  Activities of Daily Living      Permission Sought/Granted   Permission granted to share information with : Yes, Verbal Permission Granted     Permission granted to share info w AGENCY: [place]        Emotional Assessment       Orientation: : Oriented to Self Alcohol / Substance Use: Not Applicable Psych Involvement:  No (comment)  Admission diagnosis:  Urinary tract infection without hematuria, site unspecified [N39.0] Altered mental status, unspecified altered mental status type [B51.02] Acute metabolic encephalopathy [H85.27] Patient Active Problem List   Diagnosis Date Noted  . Acute delirium   . Acute cystitis without hematuria   . Mediastinal mass   . Weakness   . Acute metabolic encephalopathy 78/24/2353  . Parkinson disease ([place]) 07/08/2019  . AF (paroxysmal atrial fibrillation) ([place]) 07/08/2019  . Complete heart block ([place]) 07/08/2019  . History of GI bleed 07/08/2019  . Chronic diastolic CHF (congestive heart failure) ([place]) 07/08/2019  . GIB (gastrointestinal bleeding) 05/10/2018  . SVT (supraventricular tachycardia) ([place]) 05/04/2018  . SCC (squamous cell carcinoma), face 06/19/2016  . Medicare annual wellness visit, initial 02/11/2016  . Hypothyroidism 08/09/2015  . Benign essential hypertension 08/09/2015  . Hyperlipidemia, mixed 08/09/2015  . Aortic stenosis, moderate 02/07/2015   PCP:  Rusty Aus, MD Pharmacy:   [place], [address] [place] [address] Phone: [phone] Fax: [phone]     Social Determinants of Health (SDOH) Interventions    Readmission Risk Interventions No flowsheet data found.

## 2019-07-11 DIAGNOSIS — B9620 Unspecified Escherichia coli [E. coli] as the cause of diseases classified elsewhere: Secondary | ICD-10-CM

## 2019-07-11 DIAGNOSIS — N390 Urinary tract infection, site not specified: Secondary | ICD-10-CM

## 2019-07-11 LAB — CBC
HCT: 30.1 % — ABNORMAL LOW (ref 36.0–46.0)
Hemoglobin: 10 g/dL — ABNORMAL LOW (ref 12.0–15.0)
MCH: 32.6 pg (ref 26.0–34.0)
MCHC: 33.2 g/dL (ref 30.0–36.0)
MCV: 98 fL (ref 80.0–100.0)
Platelets: 524 10*3/uL — ABNORMAL HIGH (ref 150–400)
RBC: 3.07 MIL/uL — ABNORMAL LOW (ref 3.87–5.11)
RDW: 13.9 % (ref 11.5–15.5)
WBC: 6.9 10*3/uL (ref 4.0–10.5)
nRBC: 0.4 % — ABNORMAL HIGH (ref 0.0–0.2)

## 2019-07-11 LAB — COMPREHENSIVE METABOLIC PANEL
ALT: 5 U/L (ref 0–44)
AST: 30 U/L (ref 15–41)
Albumin: 3.6 g/dL (ref 3.5–5.0)
Alkaline Phosphatase: 107 U/L (ref 38–126)
Anion gap: 8 (ref 5–15)
BUN: 19 mg/dL (ref 8–23)
CO2: 25 mmol/L (ref 22–32)
Calcium: 8.9 mg/dL (ref 8.9–10.3)
Chloride: 105 mmol/L (ref 98–111)
Creatinine, Ser: 0.77 mg/dL (ref 0.44–1.00)
GFR calc Af Amer: >60 mL/min (ref >60)
GFR calc non Af Amer: >60 mL/min (ref >60)
Glucose, Bld: 103 mg/dL — ABNORMAL HIGH (ref 70–99)
Potassium: 3.5 mmol/L (ref 3.5–5.1)
Sodium: 138 mmol/L (ref 135–145)
Total Bilirubin: 0.8 mg/dL (ref 0.3–1.2)
Total Protein: 5.8 g/dL — ABNORMAL LOW (ref 6.5–8.1)

## 2019-07-11 MED ORDER — QUETIAPINE FUMARATE 25 MG PO TABS
37.5000 mg | ORAL_TABLET | Freq: Every day | ORAL | Status: DC
  Administered 2019-07-11: 20:00:00 37.5 mg via ORAL
  Filled 2019-07-11: qty 2

## 2019-07-11 MED ORDER — RISPERIDONE 1 MG PO TABS
1.0000 mg | ORAL_TABLET | Freq: Three times a day (TID) | ORAL | Status: DC | PRN
  Filled 2019-07-11: qty 1

## 2019-07-11 NOTE — Care Management Important Message (Signed)
Important Message  Patient Details  Name: Olivia [name] MRN: [id number] Date of Birth: 05-29-1925   Medicare Important Message Given:  N/A - LOS <3 / Initial given by admissions     [name] 07/11/2019, 7:56 AM

## 2019-07-11 NOTE — Progress Notes (Signed)
[place] Room [facility]) Hospital Liaison RN note  Notified by Doran Clay of family desire for [facility] services at discharge. Chart and patient information under review. Hospice eligibility pending at this time.  Spoke with son, Dr. Jimmye Norman, in the hallway to initiate education related to hospice philosophy, services and team approach to care. Son verbalized understanding of information given. Per discussion, plan is for patient to discharge home tomorrow hopefully with EMS.  DME needs discussed. Family requesting hospital bed, OBT and 3 in 1 to be delivered to the home. Address has been verified and is correct in the chart. Marquette Brunell, hired caregiver, is the person to contact for delivery at [phone].  Please send signed and completed DNR home with patient.  Pt will need prescriptions for discharge comfort medications.  [facility] aware of the above. Above information share with Rudi Rummage of care team.  Please call with any questions or concerns.  Thank you. Margaretmary Eddy, BSN, RN [facility] Liaison [phone]

## 2019-07-11 NOTE — TOC Progression Note (Signed)
Transition of Care [facility]) - Progression Note    Patient Details  Name: Olivia [name] MRN: [id number] Date of Birth: September 30, 1925  Transition of Care [facility]) CM/SW Contact  Shelbie Hutching, RN Phone Number: 07/11/2019, 12:55 PM  Clinical Narrative:     Insurance authorization canceled for SNF.  Expected Discharge Plan: Home w Hospice Care Barriers to Discharge: Continued Medical Work up  Expected Discharge Plan and Services Expected Discharge Plan: [place]   Discharge Planning Services: CM Consult Post Acute Care Choice: Hospice Living arrangements for the past 2 months: [place]                 DME Arranged: Hospital bed DME Agency: Other - [place]) Date DME Agency Contacted: 07/11/19 Time DME Agency Contacted: 1115 Representative spoke with at DME Agency: Margaretmary Eddy with [place] Determinants of Health ([place]) Interventions    Readmission Risk Interventions No flowsheet data found.

## 2019-07-11 NOTE — TOC Initial Note (Signed)
Transition of Care [facility]) - Initial/Assessment Note    Patient Details  Name: Olivia [name] MRN: [id number] Date of Birth: May 11, 1925  Transition of Care [facility]) CM/SW Contact:    Shelbie Hutching, RN Phone Number: 07/11/2019, 11:32 AM  Clinical Narrative:                Patient is from the [place], she is in a cottage there.  Patient's son Dr. Jimmye Norman is at the bedside this morning.  Dr. Jimmye Norman would like for patient to discharge back to her [facility] with hospice services.  Family chooses [facility] for services and they will need a hospital bed.  Margaretmary Eddy with [place] given hospice at home referral.  Olivia Mackie will speak with the son.  Potential discharge for tomorrow.   Expected Discharge Plan: Home w Hospice Care Barriers to Discharge: Continued Medical Work up   Patient Goals and CMS Choice Patient states their goals for this hospitalization and ongoing recovery are:: Son would like patient to go home with Hospice CMS Medicare.gov Compare Post Acute Care list provided to:: Patient Represenative (must comment) Choice offered to / list presented to : Adult Children(Son Dr. Shellia Cleverly)  Expected Discharge Plan and Services Expected Discharge Plan: Home w Hospice Care   Discharge Planning Services: CM Consult Post Acute Care Choice: Hospice Living arrangements for the past 2 months: [place]                 DME Arranged: Hospital bed DME Agency: Other - [place]) Date DME Agency Contacted: 07/11/19 Time DME Agency Contacted: [id number] Representative spoke with at DME Agency: Margaretmary Eddy with [place]            Prior Living Arrangements/Services Living arrangements for the past 2 months: [place] Lives with:: Self Patient language and need for interpreter reviewed:: Yes Do you feel safe going back to the place where you live?: Yes      Need for Family Participation  in Patient Care: Yes (Comment)(delerium - home with hospice) Care giver support system in place?: Yes (comment)(son) Current home services: Homehealth aide Criminal Activity/Legal Involvement Pertinent to Current Situation/Hospitalization: No - Comment as needed  Activities of Daily Living      Permission Sought/Granted Permission sought to share information with : Case Manager, Customer service manager, Family Supports Permission granted to share information with : Yes, Verbal Permission Granted  Share Information with NAME: Dr. Jimmye Norman  Permission granted to share info w AGENCY: [place], [facility]  Permission granted to share info w Relationship: son     Emotional Assessment Appearance:: Appears stated age Attitude/Demeanor/Rapport: Lethargic   Orientation: : Oriented to Self, Oriented to Place Alcohol / Substance Use: Not Applicable Psych Involvement: No (comment)  Admission diagnosis:  Urinary tract infection without hematuria, site unspecified [N39.0] Altered mental status, unspecified altered mental status type [I34.74] Acute metabolic encephalopathy [Q59.56] Patient Active Problem List   Diagnosis Date Noted  . Lymphadenopathy, generalized   . Acute delirium   . Acute cystitis without hematuria   . Mediastinal mass   . Weakness   . Acute metabolic encephalopathy 38/75/6433  . Parkinson disease ([place]) 07/08/2019  . AF (paroxysmal atrial fibrillation) ([place]) 07/08/2019  . Complete heart block ([place]) 07/08/2019  . History of GI bleed 07/08/2019  . Chronic diastolic CHF (congestive heart failure) ([place]) 07/08/2019  . GIB (gastrointestinal bleeding) 05/10/2018  . SVT (supraventricular tachycardia) ([place]) 05/04/2018  .  SCC (squamous cell carcinoma), face 06/19/2016  . Medicare annual wellness visit, initial 02/11/2016  . Hypothyroidism 08/09/2015  . Benign essential hypertension 08/09/2015  . Hyperlipidemia, mixed 08/09/2015  . Aortic  stenosis, moderate 02/07/2015   PCP:  Rusty Aus, MD Pharmacy:   [place], [place] [address] Phone: [phone] Fax: [phone]     Social Determinants of Health (SDOH) Interventions    Readmission Risk Interventions No flowsheet data found.

## 2019-07-11 NOTE — Progress Notes (Signed)
Patient ID: Olivia [name], female   DOB: Jul 26, 1925, 86 y.o.   MRN: [id number] [facility] PROGRESS NOTE  VERNADINE COOMBS JAS:[id number] DOB: 12-27-1925 [name]: 07/08/2019 PCP: Rusty Aus, MD  HPI/Subjective: Patient did not sleep last night and was sleeping this morning when I came by the first time.  She was sleeping again the second time when I came by but her son was at the bedside so I did wake her up and asked her a few questions.  She easily went back to sleep.  Objective: Vitals:   07/10/19 0829 07/10/19 2358  BP: (!) 152/89 (!) 137/57  Pulse: 99 75  Resp: 16 16  Temp: 98.8 F (37.1 C) 97.7 F (36.5 C)  SpO2: 96% 96%    Intake/Output Summary (Last 24 hours) at 07/11/2019 1234 Last data filed at 07/11/2019 1001 Gross per 24 hour  Intake 866.15 ml  Output 500 ml  Net 366.15 ml   Filed Weights   07/08/19 1934 07/09/19 0023  Weight: 71 kg 71 kg    ROS: Review of Systems  Unable to perform ROS: Acuity of condition   Exam: Physical Exam  Constitutional: She appears lethargic.  HENT:  Nose: No mucosal edema.  Mouth/Throat: No oropharyngeal exudate or posterior oropharyngeal edema.  Eyes: Conjunctivae and lids are normal.  Neck: Carotid bruit is not present.  Cardiovascular: S1 normal and S2 normal. Exam reveals no gallop.  No murmur heard. Respiratory: No respiratory distress. She has decreased breath sounds in the right lower field and the left lower field. She has no wheezes. She has no rhonchi. She has no rales.  GI: Soft. There is no abdominal tenderness.  Musculoskeletal:     Right ankle: No swelling.     Left ankle: No swelling.  Lymphadenopathy:    She has no cervical adenopathy.  Neurological: She appears lethargic.  I did wake the patient up she answered a few questions went back to sleep.  Skin: Skin is warm. No rash noted. Nails show no clubbing.  Heels have slight redness but no skin breakdown  Psychiatric:  Patient answered a few questions and  went back to sleep.      Data Reviewed: Basic Metabolic Panel: Recent Labs  Lab 07/08/19 1942 07/09/19 0259 07/11/19 0526  NA 135  --  138  K 4.0  --  3.5  CL 100  --  105  CO2 24  --  25  GLUCOSE 106*  --  103*  BUN 18  --  19  CREATININE 0.92 0.84 0.77  CALCIUM 9.8  --  8.9   Liver Function Tests: Recent Labs  Lab 07/08/19 1942 07/11/19 0526  AST 31 30  ALT 6 5  ALKPHOS 147* 107  BILITOT 1.1 0.8  PROT 6.5 5.8*  ALBUMIN 4.1 3.6    Recent Labs  Lab 07/09/19 0605  AMMONIA 13   CBC: Recent Labs  Lab 07/08/19 1942 07/09/19 0259 07/11/19 0526  WBC 7.4 10.2 6.9  HGB 10.6* 11.7* 10.0*  HCT 31.3* 34.0* 30.1*  MCV 97.5 95.8 98.0  PLT 504* 548* 524*     Recent Results (from the past 240 hour(s))  Urine Culture     Status: Abnormal (Preliminary result)   Collection Time: 07/08/19  9:02 PM   Specimen: Urine, Random  Result Value Ref Range Status   Specimen Description   Final    URINE, RANDOM Performed at [facility], [address] [place], [place] [address]  Special Requests   Final    NONE Performed at [facility], [address] [place], [place] [address]    Culture (A)  Final    >=100,000 COLONIES/mL ESCHERICHIA COLI SUSCEPTIBILITIES TO FOLLOW Performed at [facility], [place] [address], [place], [place] [address]    Report Status PENDING  Incomplete  Respiratory Panel by RT PCR (Flu A&B, Covid) - Nasopharyngeal Swab     Status: None   Collection Time: 07/08/19 11:25 PM   Specimen: Nasopharyngeal Swab  Result Value Ref Range Status   SARS Coronavirus 2 by RT PCR NEGATIVE NEGATIVE Final    Comment: (NOTE) SARS-CoV-2 target nucleic acids are NOT DETECTED. The SARS-CoV-2 RNA is generally detectable in upper respiratoy specimens during the acute phase of infection. The lowest concentration of SARS-CoV-2 viral copies this assay can detect is 131 copies/mL. A negative result does not preclude SARS-Cov-2 infection and  should not be used as the sole basis for treatment or other patient management decisions. A negative result may occur with  improper specimen collection/handling, submission of specimen other than nasopharyngeal swab, presence of viral mutation(s) within the areas targeted by this assay, and inadequate number of viral copies (<131 copies/mL). A negative result must be combined with clinical observations, patient history, and epidemiological information. The expected result is Negative. Fact Sheet for Patients:  PinkCheek.be Fact Sheet for Healthcare Providers:  GravelBags.it This test is not yet ap proved or cleared by the [place] FDA and  has been authorized for detection and/or diagnosis of SARS-CoV-2 by FDA under an Emergency Use Authorization (EUA). This EUA will remain  in effect (meaning this test can be used) for the duration of the COVID-19 declaration under Section 564(b)(1) of the Act, 21 U.S.C. section 360bbb-3(b)(1), unless the authorization is terminated or revoked sooner.    Influenza A by PCR NEGATIVE NEGATIVE Final   Influenza B by PCR NEGATIVE NEGATIVE Final    Comment: (NOTE) The Xpert Xpress SARS-CoV-2/FLU/RSV assay is intended as an aid in  the diagnosis of influenza from Nasopharyngeal swab specimens and  should not be used as a sole basis for treatment. Nasal washings and  aspirates are unacceptable for Xpert Xpress SARS-CoV-2/FLU/RSV  testing. Fact Sheet for Patients: PinkCheek.be Fact Sheet for Healthcare Providers: GravelBags.it This test is not yet approved or cleared by the [place] FDA and  has been authorized for detection and/or diagnosis of SARS-CoV-2 by  FDA under an Emergency Use Authorization (EUA). This EUA will remain  in effect (meaning this test can be used) for the duration of the  Covid-19 declaration under Section  564(b)(1) of the Act, 21  U.S.C. section 360bbb-3(b)(1), unless the authorization is  terminated or revoked. Performed at [facility], [address], [place],  [address]      Studies: CT CHEST W CONTRAST  Result Date: 07/10/2019 CLINICAL DATA:  Inpatient. Mediastinal mass and right supraclavicular lymphadenopathy on neck CT angiogram from 1 day prior. EXAM: CT CHEST, ABDOMEN, AND PELVIS WITH CONTRAST TECHNIQUE: Multidetector CT imaging of the chest, abdomen and pelvis was performed following the standard protocol during bolus administration of intravenous contrast. CONTRAST:  139mL OMNIPAQUE IOHEXOL 300 MG/ML  SOLN COMPARISON:  07/09/2019 neck CT angiogram. FINDINGS: CT CHEST FINDINGS Cardiovascular: Top-normal heart size. 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 Two lead left subclavian pacemaker with lead tips in the right atrium and right ventricular apex. No significant pericardial effusion/thickening. Left anterior descending coronary atherosclerosis. Atherosclerotic nonaneurysmal thoracic aorta. Top-normal caliber main  pulmonary artery (3.1 cm diameter). No central pulmonary emboli. Mediastinum/Nodes: No discrete thyroid nodules. Unremarkable esophagus. No axillary adenopathy. Enlarged 2.3 cm right supraclavicular node (series 2/image 7). Bulky right paratracheal adenopathy up to the 3.2 cm (series 2/image 20). Multiple mildly enlarged bilateral prevascular lymph nodes, largest 1.2 cm on the right (series 2/image 21). Enlarged 1.1 cm subcarinal node (series 2/image 29). Enlarged 1.2 cm right hilar node (series 2/image 30). No left hilar adenopathy. Lungs/Pleura: No pneumothorax. No pleural effusion. Mild passive atelectasis in the dependent lower lobes bilaterally. No acute consolidative airspace disease or lung masses. Apical left upper lobe 4 mm solid pulmonary nodule (series 4/image 25). No additional significant pulmonary nodules. Musculoskeletal: No aggressive appearing focal osseous  lesions. Mild thoracic spondylosis. CT ABDOMEN PELVIS FINDINGS Hepatobiliary: Normal liver size. Subcentimeter hypervascular left liver focus (series 2/image 49). No additional liver lesions. Cholecystectomy. No biliary ductal dilatation. Pancreas: Normal, with no mass or duct dilation. Spleen: Normal size spleen. Ill-defined low-attenuation lesions throughout the spleen, largest 2.3 cm (series 2/image 61). Adrenals/Urinary Tract: Normal adrenals. Subcentimeter left renal cortical lesions are too small to characterize and require no follow-up. Otherwise normal kidneys, with no hydronephrosis. Excreted contrast in the bladder from yesterday's CT study. Mild bladder wall trabeculation. Stomach/Bowel: Small hiatal hernia. Otherwise normal nondistended stomach. Normal caliber small bowel with no small bowel wall thickening. Appendix not discretely visualized. Marked diffuse colonic diverticulosis, with no large bowel wall thickening or significant pericolonic fat stranding. Vascular/Lymphatic: Atherosclerotic nonaneurysmal abdominal aorta. Patent portal, splenic, hepatic and renal veins. Enlarged 1.8 cm portacaval node (series 2/image 62). Enlarged 1.4 cm gastrohepatic ligament node (series 2/image 58). No additional pathologically enlarged abdominopelvic nodes. Reproductive: No adnexal masses. Mildly enlarged and presumably myomatous uterus. Other: No pneumoperitoneum, ascites or focal fluid collection. Musculoskeletal: No aggressive appearing focal osseous lesions. Moderate lumbar spondylosis. IMPRESSION: 1. Right supraclavicular, right paratracheal, prevascular, subcarinal, right hilar, gastrohepatic ligament and portacaval lymphadenopathy. Several ill-defined low-attenuation splenic lesions. Findings are most suggestive of lymphoma. Metastatic adenopathy of unknown primary site is on the differential. The right supraclavicular node may be accessible for percutaneous biopsy. 2. Solitary 4 mm left upper lobe pulmonary  nodule. Recommend attention on follow-up chest CT in 3 months. 3. Nonspecific subcentimeter hypervascular left liver focus. Depending on biopsy results, follow-up MRI abdomen without and with IV contrast could be obtained for further evaluation in 3 months. 4. Chronic findings include: Marked diffuse colonic diverticulosis. Small hiatal hernia. Mildly enlarged and presumably myomatous uterus. Aortic Atherosclerosis (ICD10-I70.0). Electronically Signed   By: Ilona Sorrel M.D.   On: 07/10/2019 08:43   CT ABDOMEN PELVIS W CONTRAST  Result Date: 07/10/2019 CLINICAL DATA:  Inpatient. Mediastinal mass and right supraclavicular lymphadenopathy on neck CT angiogram from 1 day prior. EXAM: CT CHEST, ABDOMEN, AND PELVIS WITH CONTRAST TECHNIQUE: Multidetector CT imaging of the chest, abdomen and pelvis was performed following the standard protocol during bolus administration of intravenous contrast. CONTRAST:  182mL OMNIPAQUE IOHEXOL 300 MG/ML  SOLN COMPARISON:  07/09/2019 neck CT angiogram. FINDINGS: CT CHEST FINDINGS Cardiovascular: Top-normal heart size. 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 Two lead left subclavian pacemaker with lead tips in the right atrium and right ventricular apex. No significant pericardial effusion/thickening. Left anterior descending coronary atherosclerosis. Atherosclerotic nonaneurysmal thoracic aorta. Top-normal caliber main pulmonary artery (3.1 cm diameter). No central pulmonary emboli. Mediastinum/Nodes: No discrete thyroid nodules. Unremarkable esophagus. No axillary adenopathy. Enlarged 2.3 cm right supraclavicular node (series 2/image 7). Bulky right paratracheal adenopathy up to the 3.2 cm (series 2/image 20).  Multiple mildly enlarged bilateral prevascular lymph nodes, largest 1.2 cm on the right (series 2/image 21). Enlarged 1.1 cm subcarinal node (series 2/image 29). Enlarged 1.2 cm right hilar node (series 2/image 30). No left hilar adenopathy. Lungs/Pleura: No pneumothorax. No pleural  effusion. Mild passive atelectasis in the dependent lower lobes bilaterally. No acute consolidative airspace disease or lung masses. Apical left upper lobe 4 mm solid pulmonary nodule (series 4/image 25). No additional significant pulmonary nodules. Musculoskeletal: No aggressive appearing focal osseous lesions. Mild thoracic spondylosis. CT ABDOMEN PELVIS FINDINGS Hepatobiliary: Normal liver size. Subcentimeter hypervascular left liver focus (series 2/image 49). No additional liver lesions. Cholecystectomy. No biliary ductal dilatation. Pancreas: Normal, with no mass or duct dilation. Spleen: Normal size spleen. Ill-defined low-attenuation lesions throughout the spleen, largest 2.3 cm (series 2/image 61). Adrenals/Urinary Tract: Normal adrenals. Subcentimeter left renal cortical lesions are too small to characterize and require no follow-up. Otherwise normal kidneys, with no hydronephrosis. Excreted contrast in the bladder from yesterday's CT study. Mild bladder wall trabeculation. Stomach/Bowel: Small hiatal hernia. Otherwise normal nondistended stomach. Normal caliber small bowel with no small bowel wall thickening. Appendix not discretely visualized. Marked diffuse colonic diverticulosis, with no large bowel wall thickening or significant pericolonic fat stranding. Vascular/Lymphatic: Atherosclerotic nonaneurysmal abdominal aorta. Patent portal, splenic, hepatic and renal veins. Enlarged 1.8 cm portacaval node (series 2/image 62). Enlarged 1.4 cm gastrohepatic ligament node (series 2/image 58). No additional pathologically enlarged abdominopelvic nodes. Reproductive: No adnexal masses. Mildly enlarged and presumably myomatous uterus. Other: No pneumoperitoneum, ascites or focal fluid collection. Musculoskeletal: No aggressive appearing focal osseous lesions. Moderate lumbar spondylosis. IMPRESSION: 1. Right supraclavicular, right paratracheal, prevascular, subcarinal, right hilar, gastrohepatic ligament and  portacaval lymphadenopathy. Several ill-defined low-attenuation splenic lesions. Findings are most suggestive of lymphoma. Metastatic adenopathy of unknown primary site is on the differential. The right supraclavicular node may be accessible for percutaneous biopsy. 2. Solitary 4 mm left upper lobe pulmonary nodule. Recommend attention on follow-up chest CT in 3 months. 3. Nonspecific subcentimeter hypervascular left liver focus. Depending on biopsy results, follow-up MRI abdomen without and with IV contrast could be obtained for further evaluation in 3 months. 4. Chronic findings include: Marked diffuse colonic diverticulosis. Small hiatal hernia. Mildly enlarged and presumably myomatous uterus. Aortic Atherosclerosis (ICD10-I70.0). Electronically Signed   By: Ilona Sorrel M.D.   On: 07/10/2019 08:43    Scheduled Meds: . aspirin  81 mg Oral Daily  . atorvastatin  20 mg Oral Daily  . carbidopa-levodopa  2.5 tablet Oral TID  . enoxaparin (LOVENOX) injection  40 mg Subcutaneous Q24H  . levothyroxine  50 mcg Oral Daily  . metoprolol succinate  25 mg Oral Daily  . pantoprazole  40 mg Oral Daily  . QUEtiapine  37.5 mg Oral QHS  . sodium chloride flush  10-40 mL Intracatheter Q12H   Continuous Infusions: . sodium chloride Stopped (07/10/19 2335)  . cefTRIAXone (ROCEPHIN)  IV Stopped (07/10/19 2142)    Assessment/Plan:  1. Acute delirium with hallucination.  Patient did not sleep very well last night.  Continue Seroquel at night.  As needed Risperdal during the day.  Hopefully can get a good night sleep tonight and reset her time clock and potentially out of the hospital tomorrow. 2. Lymphadenopathy in the neck, chest and abdomen.  Suspected lymphoma.  We will hold off on biopsy at this point.  Son is interested in going back to her residence at the [facility] with hospice care.  Case discussed with transitional care team. 3. Acute cystitis  without hematuria.  Urine culture growing E. coli with  sensitivities pending.  I spoke with the microbiology lab and they do not think that this is an ESBL organism but sensitivities will not be back until tomorrow since they had to repeat them.  Currently on Rocephin.  Need to see sensitivities prior to disposition.  We will get a bladder scan since not much urine is in the canister. 4. Parkinson's disease on Sinemet.  Try to avoid Haldol if agitated. 5. Paroxysmal atrial fibrillation on aspirin, metoprolol. 6. Hyperlipidemia unspecified on atorvastatin 7. Hypothyroidism unspecified on levothyroxine 8. Patient on dysphagia 1 diet with thin liquids  Code Status:     Code Status Orders  (From admission, onward)         Start     Ordered   07/09/19 0134  Do not attempt resuscitation (DNR)  Continuous    Question Answer Comment  In the event of cardiac or respiratory ARREST Do not call a "code blue"   In the event of cardiac or respiratory ARREST Do not perform Intubation, CPR, defibrillation or ACLS   In the event of cardiac or respiratory ARREST Use medication by any route, position, wound care, and other measures to relive pain and suffering. May use oxygen, suction and manual treatment of airway obstruction as needed for comfort.      07/09/19 0133        Code Status History    Date Active Date Inactive Code Status Order ID Comments User Context   07/09/2019 0133 07/09/2019 0133 Full Code [id number]  Gwynne Edinger, MD Inpatient   05/11/2018 1534 05/13/2018 1950 Full Code [id number]  [name], MD Inpatient   05/10/2018 1944 05/11/2018 1534 DNR [id number]  Bettey Costa, MD ED   05/04/2018 1824 05/05/2018 1508 Full Code [id number]  Henreitta Leber, MD ED   Advance Care Planning Activity     Family Communication: Spoke with the patient's son at the bedside. Disposition Plan: Trying to set up home with hospice with 24/7 care potentially for tomorrow.  Need to see sensitivities of the urinary tract infection prior to disposition.  Would  also like to see a good night sleep and clearing of delirium prior to disposition.  Consultants:  Speech therapy  Palliative care  Hospice  Antibiotics:  Rocephin  Time spent: 26 minutes.  Case discussed with nursing staff and transitional care team.  Loletha Grayer  [facility]

## 2019-07-12 DIAGNOSIS — E785 Hyperlipidemia, unspecified: Secondary | ICD-10-CM

## 2019-07-12 LAB — URINE CULTURE: Culture: >=100000 — AB

## 2019-07-12 MED ORDER — AMOXICILLIN-POT CLAVULANATE 500-125 MG PO TABS
1.0000 | ORAL_TABLET | Freq: Two times a day (BID) | ORAL | 0 refills | Status: AC
Start: 2019-07-12 — End: 2019-07-15

## 2019-07-12 MED ORDER — QUETIAPINE FUMARATE 25 MG PO TABS: 25.0000 mg | ORAL_TABLET | Freq: Every evening | ORAL | 0 refills | Status: DC | PRN

## 2019-07-12 MED ORDER — QUETIAPINE FUMARATE 25 MG PO TABS: 25.0000 mg | ORAL_TABLET | Freq: Every evening | ORAL | 0 refills | Status: AC | PRN

## 2019-07-12 NOTE — TOC Transition Note (Signed)
Transition of Care [facility]) - CM/SW Discharge Note   Patient Details  Name: Olivia [name] MRN: [id number] Date of Birth: 1925-04-18  [facility] CM/SW Contact:  Shelbie Hutching, RN Phone Number: 07/12/2019, 10:19 AM   Clinical Narrative:    Patient will discharge home with hospice care with [facility].  Olivia Mackie with [facility] is aware of discharge and equipment has been delivered.  Patient is going back to [facility].  Son updated on plan of care for discharge.  [facility] will provide transportation- this RNCM will call EMS at 1100 to schedule pickup.    Final next level of care: Home w Hospice Care Barriers to Discharge: Barriers Resolved   Patient Goals and CMS Choice Patient states their goals for this hospitalization and ongoing recovery are:: Home with Hospice CMS Medicare.gov Compare Post Acute Care list provided to:: Patient Represenative (must comment) Choice offered to / list presented to : Adult Children(son)  Discharge Placement                Patient to be transferred to facility by: [facility] to take patient home Name of family member notified: Kea Callan- son Patient and family notified of of transfer: 07/12/19  Discharge Plan and Services   Discharge Planning Services: CM Consult Post Acute Care Choice: Hospice          DME Arranged: Hospital bed DME Agency: Other - [facility]) Date DME Agency Contacted: 07/11/19 Time DME Agency Contacted: 1115 Representative spoke with at DME Agency: Margaretmary Eddy with [facility] Determinants of Health ([place]) Interventions     Readmission Risk Interventions No flowsheet data found.

## 2019-07-12 NOTE — Progress Notes (Signed)
Discharge instructions provided to patient and patient's son who is at the bedside. Vital signs are stable. Breathing is even and unlabored. No distress is noted. Midline removed (confirmed with Dr. Earleen Newport to remove) per protocol, no complications. EMS transport here. [place] DNR in packet taken with EMS. All belongings taken with patient's son. All safety measures are in place.

## 2019-07-12 NOTE — Discharge Instructions (Signed)
Delirium Delirium is a state of mental confusion. It comes on quickly and causes significant changes in a person's thinking and behavior. People with delirium usually have trouble paying attention to what is going on or knowing where they are. They may become very withdrawn or very emotional and unable to sit still. They may even see or feel things that are not there (hallucinations). Delirium is a sign of a serious underlying medical condition. What are the causes? Delirium occurs when something suddenly affects the signals that the brain sends out. Brain signals can be affected by anything that puts severe stress on the body and brain and causes brain chemicals to be out of balance. The most common causes of delirium include:  Infections. These may be bacterial, viral, fungal, or protozoal.  Medicines. These include many over-the-counter and prescription medicines.  Recreational drugs.  Substance withdrawal. This occurs with sudden discontinuation of alcohol, certain medicines, or recreational drugs.  Surgery and anesthesia.  Sudden vascular events, such as stroke and brain hemorrhage.  Other brain disorders, such as migraines, tumors, seizures, and physical head trauma.  Metabolic disorders, such as kidney or liver failure.  Low blood oxygen (anoxia). This may occur with lung disease, cardiac arrest, or carbon monoxide poisoning.  Hormone imbalances (endocrinopathies), such as an overactive thyroid (hyperthyroidism) or underactive thyroid (hypothyroidism).  Vitamin deficiencies. What increases the risk? The following factors may make someone more likely to develop this condition.  Being a child.  Being an older person.  Living alone.  Having vision loss or hearing loss.  Having an existing brain disease, such as dementia.  Having long-lasting (chronic) medical conditions, such as heart disease.  Being hospitalized for long periods of time. What are the signs or  symptoms? Delirium starts with a sudden change in a person's thinking or behavior. Symptoms include:  Not being able to stay awake (drowsiness) or pay attention.  Being confused about places, time, and people.  Forgetfulness.  Having extreme energy levels. These may be low or high.  Changes in sleep patterns.  Extreme mood swings, such as sudden anger or anxiety.  Focusing on things or ideas that are not important.  Rambling and senseless talking.  Difficulty speaking, understanding speech, or both.  Hallucinations.  Tremor or unsteady gait. Symptoms come and go (fluctuate) over time, and they are often worse at the end of the day. How is this diagnosed? People with delirium may not realize that they have the condition. Often, a family member or health care provider is the first person to notice the changes. This condition may be diagnosed based on a physical exam, health history, and tests.  The health care provider will obtain a detailed history. This may include questions about: ? Current symptoms. ? Medical issues. ? Medicines. ? Recreational drug use.  The health care provider will perform a mental status examination by: ? Asking questions to check for confusion. ? Watching for abnormal behavior.  The health care provider may also order lab tests or additional studies to determine the cause of the delirium. How is this treated? Treatment of delirium depends on the cause and severity. Delirium usually goes away within days or weeks of treating the underlying cause. In the meantime, do not leave the person alone because he or she may accidentally cause self-harm. This condition may be treated with supportive care, such as:  Increased light during the day and decreased light at night.  Low noise level.  Uninterrupted sleep.  A regular daily schedule.    Clocks and calendars to help with orientation.  Familiar objects, including the person's pictures and  clothing.  Frequent visits from familiar family and friends.  A healthy diet.  Gentle exercise. In more severe cases of delirium, medicine may be prescribed to help the person keep calm and think more clearly. Follow these instructions at home:  Continue supportive care as told by a health care provider.  Over-the-counter and prescription medicines should be taken only as told by a health care provider.  Ask a health care provider before using herbs or supplements.  Do not use alcohol or recreational drugs.  Keep all follow-up visits as told by a health care provider. This is important. Contact a health care provider if:  Symptoms do not get better or they become worse.  New symptoms of delirium develop.  Caring for the person at home does not seem safe.  Eating, drinking, or communicating stops.  There are side effects of medicines, such as changes in sleep patterns, dizziness, weight gain, restlessness, movement changes, or tremors. Get help right away if:  Serious thoughts occur about self-harm or about hurting others.  There are serious side effects of medicine, such as: ? Swelling of the face, lips, tongue, or throat. ? Fever, confusion, muscle spasms, or seizures. Summary  Delirium is a state of mental confusion. It comes on quickly and causes significant changes in a person's thinking and behavior.  Delirium is a sign of a serious underlying medical condition.  Certain medical conditions or a long hospital stay may increase the risk of developing delirium.  Treatment of delirium involves treating the underlying cause and providing supportive treatments, such as a calm and familiar environment. This information is not intended to replace advice given to you by your health care provider. Make sure you discuss any questions you have with your health care provider. Document Revised: 10/15/2017 Document Reviewed: 10/15/2017 Elsevier Patient Education  2020 Elsevier  Inc.  

## 2019-07-12 NOTE — Discharge Summary (Signed)
[facility] NAME: Kimyata Milich    MR#:  [id number]  DATE OF BIRTH:  [birth year]  DATE OF ADMISSION:  07/08/2019 ADMITTING PHYSICIAN: Gwynne Edinger, MD  DATE OF DISCHARGE: 07/12/2019  PRIMARY CARE PHYSICIAN: Rusty Aus, MD    ADMISSION DIAGNOSIS:  Urinary tract infection without hematuria, site unspecified [N39.0] Altered mental status, unspecified altered mental status type [H73.42] Acute metabolic encephalopathy [A76.81]  DISCHARGE DIAGNOSIS:  Principal Problem:   Acute metabolic encephalopathy Active Problems:   Hypothyroidism   Aortic stenosis, moderate   Benign essential hypertension   Parkinson disease (HCC)   AF (paroxysmal atrial fibrillation) (HCC)   Complete heart block (HCC)   History of GI bleed   Chronic diastolic CHF (congestive heart failure) (HCC)   Acute delirium   Acute cystitis without hematuria   Mediastinal mass   Weakness   Lymphadenopathy, generalized   E-coli UTI   SECONDARY DIAGNOSIS:   Past Medical History:  Diagnosis Date  . Acquired hypothyroidism 08/09/2015  . Aortic stenosis, moderate 02/07/2015   Stable 5/17, slightly worse 5/18  . 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 Atrial fibrillation ([place])   . Benign essential hypertension 08/09/2015  . Chicken pox   . Colon polyps   . Complication of anesthesia    Fentanyl causes nausea and vomiting  . GERD (gastroesophageal reflux disease)   . H/O: GI bleed 05/2018  . Hyperlipidemia, mixed 08/09/2015  . Medicare annual wellness visit, initial 04/12/18, 08/11/16, 02/11/16  . Parkinson's disease ([place])   . Presence of permanent cardiac pacemaker 11/03/2017   [facility].  Medtronic Azure XT Dr [id number]  . SCC (squamous cell carcinoma), face 06/19/2016  . Wears hearing aid in both ears     HOSPITAL COURSE:   1.  Acute delirium with hallucination.  The patient slept better last night and is able to have a better conversation with me today.  We  will do Seroquel as needed at night for insomnia. 2.  Lymphadenopathy in the neck, chest and abdomen.  Suspected lymphoma.  Holding off on biopsy and/or treatment at this point in time.  We will set up hospice at her residence for today. 3.  Acute cystitis without hematuria.  Urine culture growing E. coli which was sensitive to the Rocephin that we gave.  I will give 5 more doses of Augmentin starting this evening. 4.  Parkinson's disease on Sinemet. 5.  Paroxysmal atrial fibrillation on aspirin and metoprolol 6.  Hyperlipidemia unspecified on atorvastatin 7.  Hypothyroidism unspecified on levothyroxine 8.  Patient on dysphagia 1 diet with thin liquids  DISCHARGE CONDITIONS:   Satisfactory  CONSULTS OBTAINED:  None  DRUG ALLERGIES:   Allergies  Allergen Reactions  . Ace Inhibitors     Other reaction(s): Cough  . Fentanyl Nausea And Vomiting  . Morphine Other (See Comments)    Other reaction(s): Muscle Pain  . Alendronate     Muscle pain    DISCHARGE MEDICATIONS:   Allergies as of 07/12/2019      Reactions   Ace Inhibitors    Other reaction(s): Cough   Fentanyl Nausea And Vomiting   Morphine Other (See Comments)   Other reaction(s): Muscle Pain   Alendronate    Muscle pain      Medication List    TAKE these medications   A+D Diaper Rash Crea Apply 1 application topically as directed.   acetaminophen 500 MG tablet Commonly known as:  TYLENOL Take 1,000 mg by mouth 2 (two) times daily.   albuterol 108 (90 Base) MCG/ACT inhaler Commonly known as: VENTOLIN HFA Inhale 2 puffs into the lungs every 6 (six) hours as needed for wheezing or shortness of breath.   amoxicillin-clavulanate 500-125 MG tablet Commonly known as: Augmentin Take 1 tablet (500 mg total) by mouth 2 (two) times daily for 5 doses.   atorvastatin 20 MG tablet Commonly known as: LIPITOR Take 20 mg by mouth daily.   carbidopa-levodopa 25-100 MG tablet Commonly known as: SINEMET IR Take 2.5  tablets by mouth 3 (three) times daily.   Cranberry 450 MG Tabs Take 450 mg by mouth daily.   ferrous gluconate 324 MG tablet Commonly known as: FERGON Take 324 mg by mouth daily.   furosemide 20 MG tablet Commonly known as: LASIX Take 20 mg by mouth daily as needed for fluid.   levothyroxine 50 MCG tablet Commonly known as: SYNTHROID Take 50 mcg by mouth daily.   metoprolol succinate 25 MG 24 hr tablet Commonly known as: TOPROL-XL Take 25 mg by mouth daily.   Multivitamin Adult Tabs Take 1 tablet by mouth daily.   pantoprazole 40 MG tablet Commonly known as: PROTONIX Take 40 mg by mouth daily.   QUEtiapine 25 MG tablet Commonly known as: SEROQUEL Take 1 tablet (25 mg total) by mouth at bedtime as needed (insomnia).        DISCHARGE INSTRUCTIONS:   Follow-up with PMD 5 days Follow-up home with hospice  If you experience worsening of your admission symptoms, develop shortness of breath, life threatening emergency, suicidal or homicidal thoughts you must seek medical attention immediately by calling 911 or calling your MD immediately  if symptoms less severe.  You Must read complete instructions/literature along with all the possible adverse reactions/side effects for all the Medicines you take and that have been prescribed to you. Take any new Medicines after you have completely understood and accept all the possible adverse reactions/side effects.   Please note  You were cared for by a hospitalist during your hospital stay. If you have any questions about your discharge medications or the care you received while you were in the hospital after you are discharged, you can call the unit and asked to speak with the hospitalist on call if the hospitalist that took care of you is not available. Once you are discharged, your primary care physician will handle any further medical issues. Please note that NO REFILLS for any discharge medications will be authorized once you are  discharged, as it is imperative that you return to your primary care physician (or establish a relationship with a primary care physician if you do not have one) for your aftercare needs so that they can reassess your need for medications and monitor your lab values.    Today   CHIEF COMPLAINT:   Chief Complaint  Patient presents with  . Altered Mental Status    HISTORY OF PRESENT ILLNESS:  Matty Deamer  is a 86 y.o. female came in with altered mental status   VITAL SIGNS:  Blood pressure (!) 146/66, pulse 94, temperature 97.7 F (36.5 C), temperature source Oral, resp. rate 16, height 5\' 6"  (1.676 m), weight 71 kg, SpO2 96 %.  I/O:    Intake/Output Summary (Last 24 hours) at 07/12/2019 0933 Last data filed at 07/12/2019 0500 Gross per 24 hour  Intake 460 ml  Output --  Net 460 ml    PHYSICAL EXAMINATION:  GENERAL:  86  y.o.-year-old patient sitting up in the bed with no acute distress.  EYES: Pupils equal, round, reactive to light and accommodation. HEENT: Head atraumatic, normocephalic. Oropharynx and nasopharynx clear.   LUNGS: Normal breath sounds bilaterally, no wheezing, rales,rhonchi or crepitation. No use of accessory muscles of respiration.  CARDIOVASCULAR: S1, S2 normal. No murmurs, rubs, or gallops.  ABDOMEN: Soft, non-tender, non-distended.  EXTREMITIES: No pedal edema.  NEUROLOGIC: Patient moving her arms on her own. PSYCHIATRIC: The patient is alert and answers most questions appropriately.  SKIN: Slight reddish skin on the heels.  DATA REVIEW:   CBC Recent Labs  Lab 07/11/19 0526  WBC 6.9  HGB 10.0*  HCT 30.1*  PLT 524*    Chemistries  Recent Labs  Lab 07/11/19 0526  NA 138  K 3.5  CL 105  CO2 25  GLUCOSE 103*  BUN 19  CREATININE 0.77  CALCIUM 8.9  AST 30  ALT 5  ALKPHOS 107  BILITOT 0.8     Microbiology Results  Results for orders placed or performed during the hospital encounter of 07/08/19  Urine Culture     Status: Abnormal    Collection Time: 07/08/19  9:02 PM   Specimen: Urine, Random  Result Value Ref Range Status   Specimen Description   Final    URINE, RANDOM Performed at [facility], [address], [place], [place] [address]    Special Requests   Final    NONE Performed at [facility], [address] [place], [place] [address]    Culture >=100,000 COLONIES/mL ESCHERICHIA COLI (A)  Final   Report Status 07/12/2019 FINAL  Final   Organism ID, Bacteria ESCHERICHIA COLI (A)  Final      Susceptibility   Escherichia coli - MIC*    AMPICILLIN 16 INTERMEDIATE Intermediate     CEFAZOLIN >=64 RESISTANT Resistant     CEFTRIAXONE 4 SENSITIVE Sensitive     CIPROFLOXACIN <=0.25 SENSITIVE Sensitive     GENTAMICIN <=1 SENSITIVE Sensitive     IMIPENEM <=0.25 SENSITIVE Sensitive     NITROFURANTOIN <=16 SENSITIVE Sensitive     TRIMETH/SULFA <=20 SENSITIVE Sensitive     AMPICILLIN/SULBACTAM 8 SENSITIVE Sensitive     PIP/TAZO <=4 SENSITIVE Sensitive     * >=100,000 COLONIES/mL ESCHERICHIA COLI  Respiratory Panel by RT PCR (Flu A&B, Covid) - Nasopharyngeal Swab     Status: None   Collection Time: 07/08/19 11:25 PM   Specimen: Nasopharyngeal Swab  Result Value Ref Range Status   SARS Coronavirus 2 by RT PCR NEGATIVE NEGATIVE Final    Comment: (NOTE) SARS-CoV-2 target nucleic acids are NOT DETECTED. The SARS-CoV-2 RNA is generally detectable in upper respiratoy specimens during the acute phase of infection. The lowest concentration of SARS-CoV-2 viral copies this assay can detect is 131 copies/mL. A negative result does not preclude SARS-Cov-2 infection and should not be used as the sole basis for treatment or other patient management decisions. A negative result may occur with  improper specimen collection/handling, submission of specimen other than nasopharyngeal swab, presence of viral mutation(s) within the areas targeted by this assay, and inadequate number of viral copies (<131  copies/mL). A negative result must be combined with clinical observations, patient history, and epidemiological information. The expected result is Negative. Fact Sheet for Patients:  PinkCheek.be Fact Sheet for Healthcare Providers:  GravelBags.it This test is not yet ap proved or cleared by the [place] FDA and  has been authorized for detection and/or diagnosis of SARS-CoV-2 by FDA  under an Emergency Use Authorization (EUA). This EUA will remain  in effect (meaning this test can be used) for the duration of the COVID-19 declaration under Section 564(b)(1) of the Act, 21 U.S.C. section 360bbb-3(b)(1), unless the authorization is terminated or revoked sooner.    Influenza A by PCR NEGATIVE NEGATIVE Final   Influenza B by PCR NEGATIVE NEGATIVE Final    Comment: (NOTE) The Xpert Xpress SARS-CoV-2/FLU/RSV assay is intended as an aid in  the diagnosis of influenza from Nasopharyngeal swab specimens and  should not be used as a sole basis for treatment. Nasal washings and  aspirates are unacceptable for Xpert Xpress SARS-CoV-2/FLU/RSV  testing. Fact Sheet for Patients: PinkCheek.be Fact Sheet for Healthcare Providers: GravelBags.it This test is not yet approved or cleared by the [place] FDA and  has been authorized for detection and/or diagnosis of SARS-CoV-2 by  FDA under an Emergency Use Authorization (EUA). This EUA will remain  in effect (meaning this test can be used) for the duration of the  Covid-19 declaration under Section 564(b)(1) of the Act, 21  U.S.C. section 360bbb-3(b)(1), unless the authorization is  terminated or revoked. Performed at [facility], [address], [place], [place] [address]       Management plans discussed with the patient, family and they are in agreement.  CODE STATUS:     Code Status Orders  (From  admission, onward)         Start     Ordered   07/09/19 0134  Do not attempt resuscitation (DNR)  Continuous    Question Answer Comment  In the event of cardiac or respiratory ARREST Do not call a "code blue"   In the event of cardiac or respiratory ARREST Do not perform Intubation, CPR, defibrillation or ACLS   In the event of cardiac or respiratory ARREST Use medication by any route, position, wound care, and other measures to relive pain and suffering. May use oxygen, suction and manual treatment of airway obstruction as needed for comfort.      07/09/19 0133        Code Status History    Date Active Date Inactive Code Status Order ID Comments User Context   07/09/2019 0133 07/09/2019 0133 Full Code [id number]  Gwynne Edinger, MD Inpatient   05/11/2018 1534 05/13/2018 1950 Full Code [id number]  [name], MD Inpatient   05/10/2018 1944 05/11/2018 1534 DNR [id number]  Bettey Costa, MD ED   05/04/2018 1824 05/05/2018 1508 Full Code [id number]  Henreitta Leber, MD ED   Advance Care Planning Activity      TOTAL TIME TAKING CARE OF THIS PATIENT: 32 minutes.    Loletha Grayer M.D on 07/12/2019 at 9:33 AM  Between 7am to 6pm - Pager - [phone]  After 6pm go to [email or web address] - password [email or web address]  [facility]  CC: Primary care physician; Rusty Aus, MD

## 2019-12-09 DEATH — deceased

## 2020-04-02 IMAGING — NM NM GI BLOOD LOSS
2 series · 12 of 12 positions shown · non-contrast
Comparison: None

Correlation: None

CLINICAL DATA: GI bleed, episodes of red blood coming from rectum
beginning 24 hours ago, decreased now, heart valve repair placed on
Eliquis last week for possible clot

EXAM:
NUCLEAR MEDICINE GASTROINTESTINAL BLEEDING SCAN
TECHNIQUE: Sequential abdominal images were obtained following intravenous
administration of Qc-IIm labeled red blood cells.
RADIOPHARMACEUTICALS:  22.07 mCi Qc-IIm pertechnetate in-vitro
labeled red cells.

[Series 1000: gi bleed · 4.80mm/px · 6 of 60 frames shown (1 of 2)]
[frame 6/60]
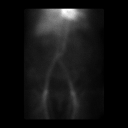
[frame 16/60]
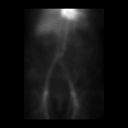
[frame 26/60]
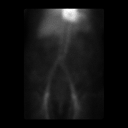
[frame 36/60]
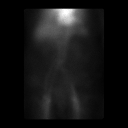
[frame 46/60]
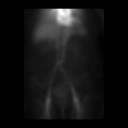
[frame 56/60]
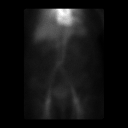

[Series 1000: gi bleed · 4.80mm/px · 6 of 60 frames shown (2 of 2)]
[frame 6/60]
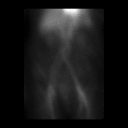
[frame 16/60]
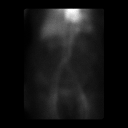
[frame 26/60]
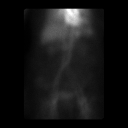
[frame 36/60]
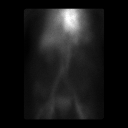
[frame 46/60]
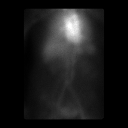
[frame 56/60]
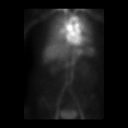

[12 of 12 positions shown; findings below may reference images not displayed]

FINDINGS: Scattered patient motion throughout the 2 hours of imaging.

Normal initial blood pool distribution of tracer. During the first
hour imaging, no abnormal gastrointestinal localization of tracer is
identified. Small amount of de labeled tracer is seen excreted into
the urinary bladder.

Second hour of imaging demonstrates initially normal blood pool
distribution of tracer. During the second hour of imaging, abnormal
gastrointestinal localization of tracer is seen at the upper mid
abdomen, curvilinear, based on position question gastric versus
distal transverse colonic source.
IMPRESSION: Abnormal GI bleeding exam demonstrating delayed GI localization of
tracer during the second hour of imaging, in a curvilinear fashion
located at the upper mid abdomen and LEFT upper quadrant, question
gastric versus transverse colonic source.

## 2021-04-08 IMAGING — MR MR HEAD W/O CM
13 of 14 series · 44 of 48 positions shown · non-contrast
Comparison: CT head 7637

CLINICAL DATA: Tremors

EXAM:
MRI HEAD WITHOUT CONTRAST
TECHNIQUE: Multiplanar, multiecho pulse sequences of the brain and surrounding
structures were obtained without intravenous contrast.

[Series 5: DWI · axial · 3.0mm · 0.88mm/px · z∈[-66,+75]mm · 8 of 96 slices shown (1 of 4)]
[im 1/96]
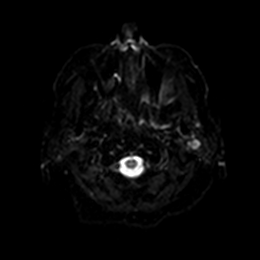
[im 14/96]
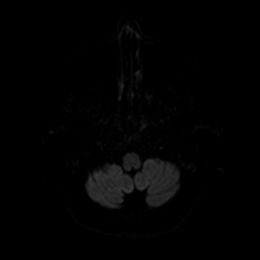
[im 28/96]
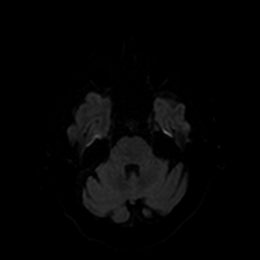
[im 41/96]
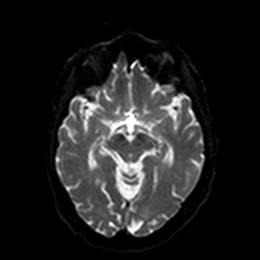
[im 55/96]
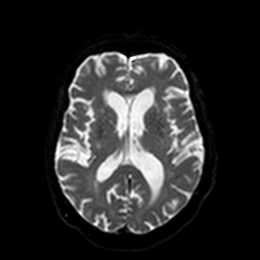
[im 68/96]
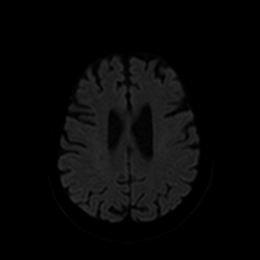
[im 82/96]
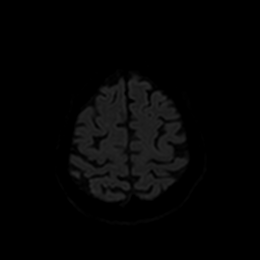
[im 96/96]
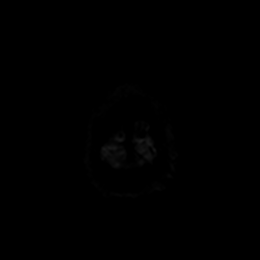

[Series 6: DWI · axial · 3.0mm · 0.88mm/px · z∈[-66,+75]mm · 4 of 48 slices shown (2 of 4)]
[im 1/48]
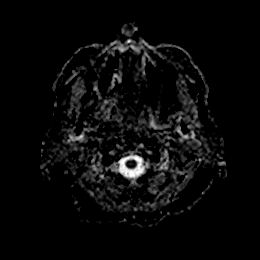
[im 16/48]
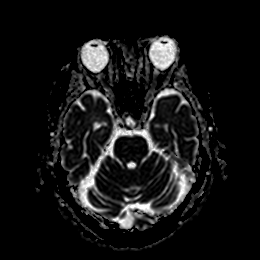
[im 32/48]
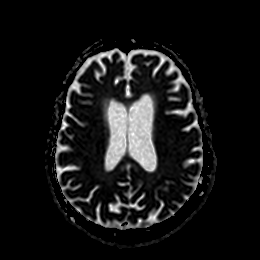
[im 48/48]
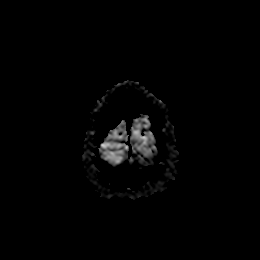

[Series 7: DWI · coronal · 4.0mm · 0.88mm/px · 5 of 64 slices shown (3 of 4)]
[im 1/64]
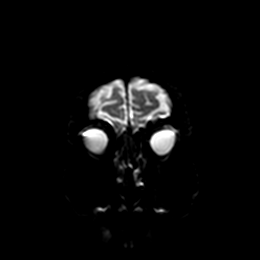
[im 16/64]
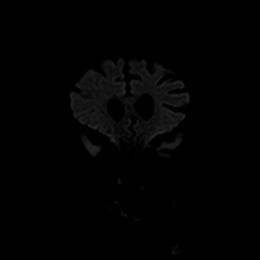
[im 32/64]
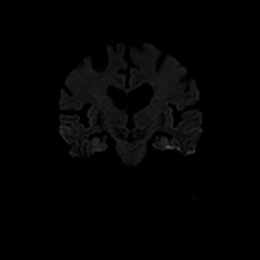
[im 48/64]
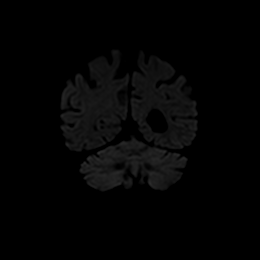
[im 64/64]
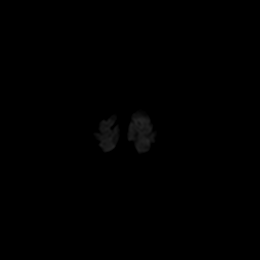

[Series 8: DWI · coronal · 4.0mm · 0.88mm/px · 2 of 32 slices shown (4 of 4)]
[im 1/32]
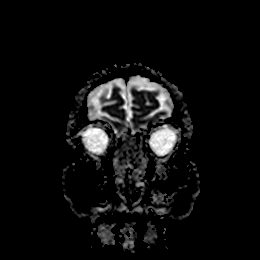
[im 32/32]
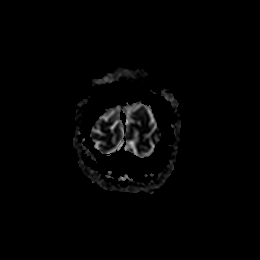

[Series 9: T1 · sagittal · 5.0mm · 0.75mm/px · 2 of 23 slices shown]
[im 1/23]
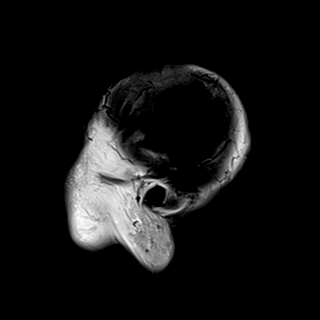
[im 23/23]
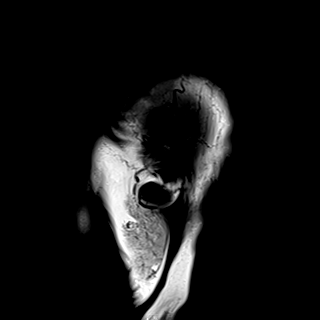

[Series 10: T2 · axial · 5.0mm · 0.72mm/px · z∈[-67,+77]mm · 2 of 25 slices shown (1 of 3)]
[im 1/25]
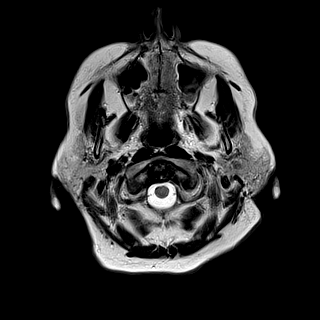
[im 25/25]
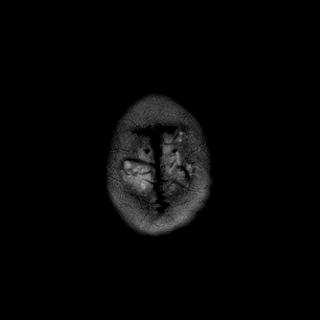

[Series 11: FLAIR · axial · 5.0mm · 0.45mm/px · z∈[-66,+78]mm · 2 of 25 slices shown]
[im 1/25]
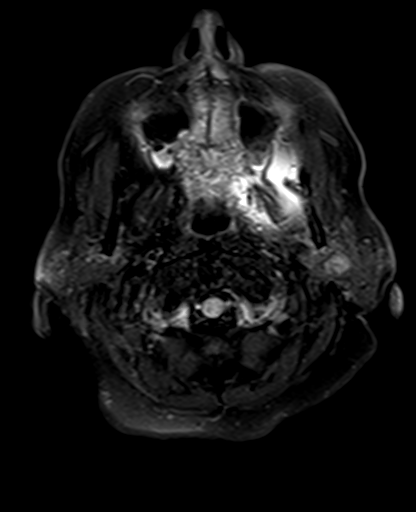
[im 25/25]
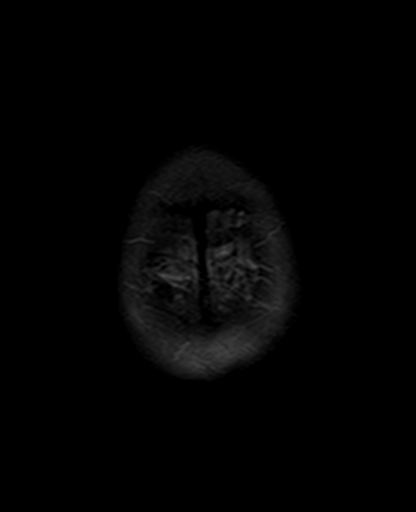

[Series 12: mag_images · axial · 3.0mm · 0.90mm/px · z∈[-73,+80]mm · 4 of 52 slices shown]
[im 1/52]
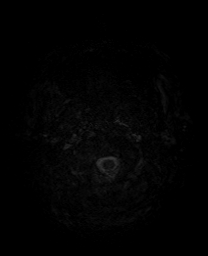
[im 18/52]
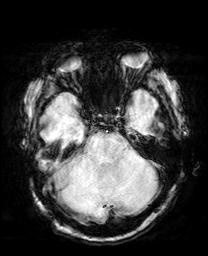
[im 35/52]
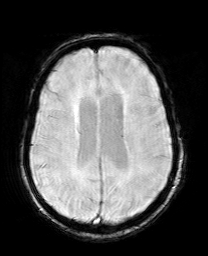
[im 52/52]
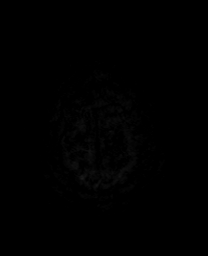

[Series 13: pha_images · axial · 3.0mm · 0.90mm/px · z∈[-73,+80]mm · 4 of 52 slices shown]
[im 1/52]
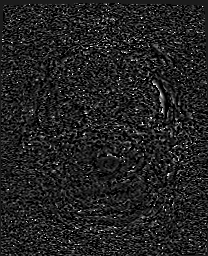
[im 18/52]
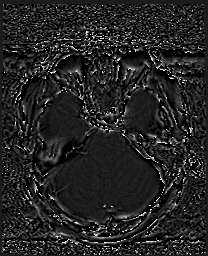
[im 35/52]
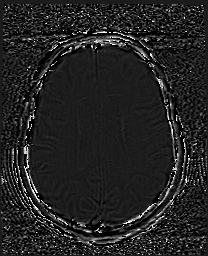
[im 52/52]
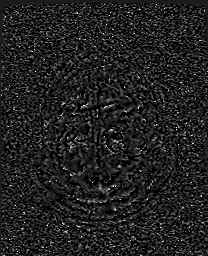

[Series 14: swi_images · axial · 3.0mm · 0.90mm/px · z∈[-73,+80]mm · 4 of 52 slices shown]
[im 1/52]
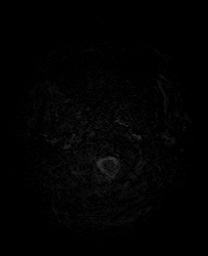
[im 18/52]
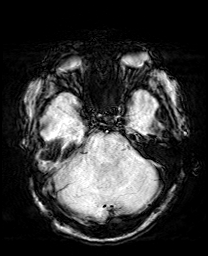
[im 35/52]
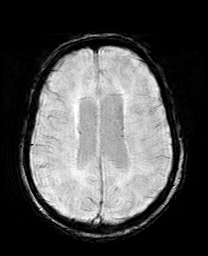
[im 52/52]
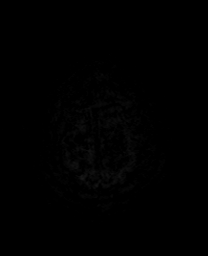

[Series 15: mip_images(sw) · axial · 24.0mm · 0.90mm/px · z∈[-62,+70]mm · 3 of 45 slices shown]
[im 1/45]
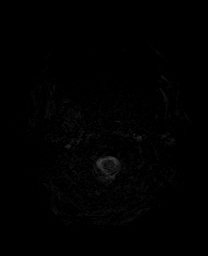
[im 23/45]
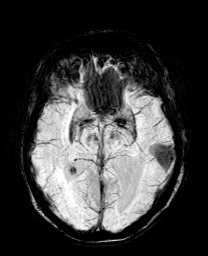
[im 45/45]
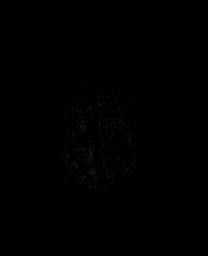

[Series 17: T2 · coronal · 5.0mm · 0.34mm/px · 2 of 29 slices shown (2 of 3)]
[im 1/29]
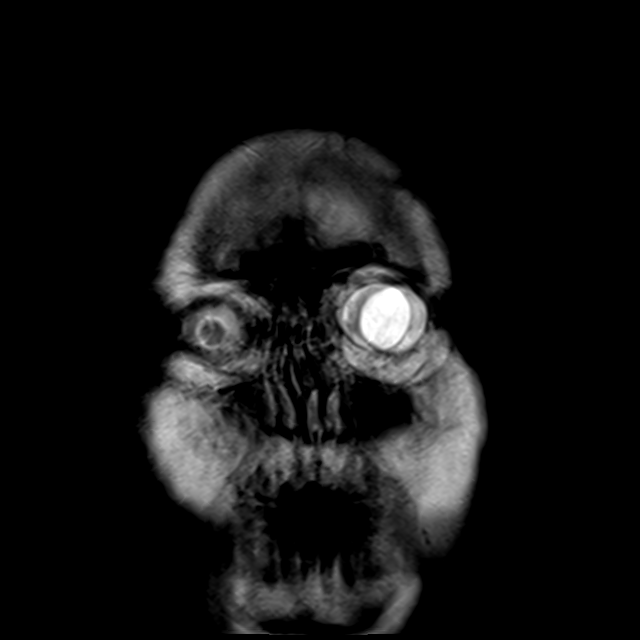
[im 29/29]
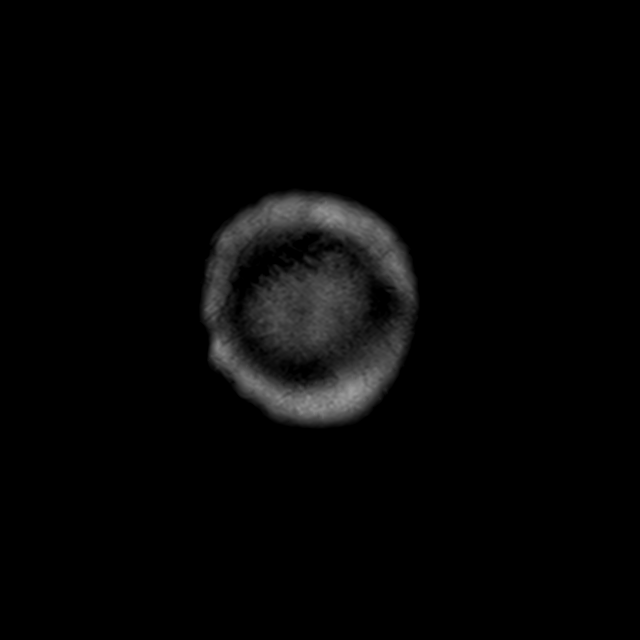

[Series 18: T2 · coronal · 5.0mm · 0.72mm/px · 2 of 28 slices shown (3 of 3)]
[im 1/28]
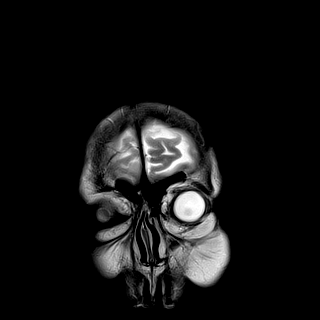
[im 28/28]
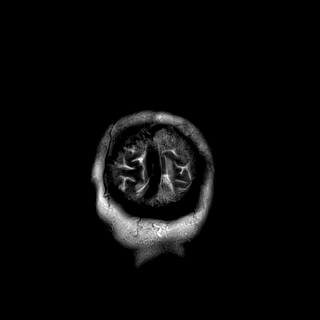

[44 of 48 positions shown; findings below may reference images not displayed]

FINDINGS: Brain: There is no acute infarction or intracranial hemorrhage.
There is no intracranial mass, mass effect, or edema. There is no
hydrocephalus or extra-axial fluid collection. Patchy T2
hyperintensity in the supratentorial white matter is nonspecific but
may reflect mild microvascular ischemic changes. Ventricles and
sulci are similar in size to 7637 CT.

Vascular: Major vessel flow voids at the skull base are preserved.

Skull and upper cervical spine: Normal marrow signal is preserved.

Sinuses/Orbits: Retained secretions within the sphenoid sinuses.
Trace ethmoid mucosal thickening. Orbits are unremarkable.

Other: Sella is unremarkable. Patchy bilateral mastoid fluid
opacification.
IMPRESSION: No acute intracranial abnormality. Mild chronic microvascular
ischemic changes.
